# Patient Record
Sex: Male | Born: 1965 | Race: Black or African American | Hispanic: No | Marital: Single | Smoking: Current every day smoker
Health system: Southern US, Community
[De-identification: ages and names within clinical notes are randomized; demographics above are authoritative.]

## PROBLEM LIST (undated history)

## (undated) DIAGNOSIS — F32A Depression, unspecified: Secondary | ICD-10-CM

## (undated) DIAGNOSIS — I82409 Acute embolism and thrombosis of unspecified deep veins of unspecified lower extremity: Secondary | ICD-10-CM

## (undated) DIAGNOSIS — F329 Major depressive disorder, single episode, unspecified: Secondary | ICD-10-CM

---

## 2014-06-14 ENCOUNTER — Inpatient Hospital Stay (HOSPITAL_COMMUNITY): Payer: Worker's Compensation

## 2014-06-14 ENCOUNTER — Encounter (HOSPITAL_COMMUNITY): Payer: Self-pay | Admitting: Certified Registered Nurse Anesthetist

## 2014-06-14 ENCOUNTER — Emergency Department (HOSPITAL_COMMUNITY): Payer: Worker's Compensation

## 2014-06-14 ENCOUNTER — Inpatient Hospital Stay (HOSPITAL_COMMUNITY): Payer: Worker's Compensation | Admitting: Certified Registered Nurse Anesthetist

## 2014-06-14 ENCOUNTER — Inpatient Hospital Stay (HOSPITAL_COMMUNITY)
Admission: EM | Admit: 2014-06-14 | Discharge: 2014-06-22 | DRG: 011 | Disposition: A | Discharge status: Home Health | Payer: Worker's Compensation | Attending: Orthopedic Surgery | Admitting: Orthopedic Surgery

## 2014-06-14 ENCOUNTER — Encounter (HOSPITAL_COMMUNITY): Admission: EM | Disposition: A | Discharge status: Home Health | Payer: Self-pay | Source: Home / Self Care

## 2014-06-14 DIAGNOSIS — S91319A Laceration without foreign body, unspecified foot, initial encounter: Secondary | ICD-10-CM | POA: Diagnosis present

## 2014-06-14 DIAGNOSIS — F17210 Nicotine dependence, cigarettes, uncomplicated: Secondary | ICD-10-CM | POA: Diagnosis present

## 2014-06-14 DIAGNOSIS — D62 Acute posthemorrhagic anemia: Secondary | ICD-10-CM | POA: Diagnosis not present

## 2014-06-14 DIAGNOSIS — R609 Edema, unspecified: Secondary | ICD-10-CM | POA: Diagnosis present

## 2014-06-14 DIAGNOSIS — S92341B Displaced fracture of fourth metatarsal bone, right foot, initial encounter for open fracture: Secondary | ICD-10-CM | POA: Diagnosis present

## 2014-06-14 DIAGNOSIS — S128XXA Fracture of other parts of neck, initial encounter: Secondary | ICD-10-CM | POA: Diagnosis present

## 2014-06-14 DIAGNOSIS — T797XXA Traumatic subcutaneous emphysema, initial encounter: Secondary | ICD-10-CM | POA: Diagnosis present

## 2014-06-14 DIAGNOSIS — S92321B Displaced fracture of second metatarsal bone, right foot, initial encounter for open fracture: Secondary | ICD-10-CM | POA: Diagnosis present

## 2014-06-14 DIAGNOSIS — S92331B Displaced fracture of third metatarsal bone, right foot, initial encounter for open fracture: Secondary | ICD-10-CM | POA: Diagnosis present

## 2014-06-14 DIAGNOSIS — W311XXA Contact with metalworking machines, initial encounter: Secondary | ICD-10-CM | POA: Diagnosis not present

## 2014-06-14 DIAGNOSIS — J9690 Respiratory failure, unspecified, unspecified whether with hypoxia or hypercapnia: Secondary | ICD-10-CM | POA: Diagnosis not present

## 2014-06-14 DIAGNOSIS — S92301B Fracture of unspecified metatarsal bone(s), right foot, initial encounter for open fracture: Secondary | ICD-10-CM | POA: Diagnosis present

## 2014-06-14 DIAGNOSIS — S92911B Unspecified fracture of right toe(s), initial encounter for open fracture: Secondary | ICD-10-CM | POA: Diagnosis present

## 2014-06-14 DIAGNOSIS — Z4659 Encounter for fitting and adjustment of other gastrointestinal appliance and device: Secondary | ICD-10-CM

## 2014-06-14 DIAGNOSIS — Y93H3 Activity, building and construction: Secondary | ICD-10-CM

## 2014-06-14 DIAGNOSIS — T148XXA Other injury of unspecified body region, initial encounter: Secondary | ICD-10-CM

## 2014-06-14 DIAGNOSIS — S1191XA Laceration without foreign body of unspecified part of neck, initial encounter: Principal | ICD-10-CM

## 2014-06-14 DIAGNOSIS — Y9269 Other specified industrial and construction area as the place of occurrence of the external cause: Secondary | ICD-10-CM

## 2014-06-14 DIAGNOSIS — Z930 Tracheostomy status: Secondary | ICD-10-CM

## 2014-06-14 HISTORY — PX: TRACHEOSTOMY TUBE PLACEMENT: SHX814

## 2014-06-14 HISTORY — PX: PERCUTANEOUS PINNING: SHX2209

## 2014-06-14 HISTORY — PX: I & D EXTREMITY: SHX5045

## 2014-06-14 HISTORY — PX: PANENDOSCOPY: SHX2159

## 2014-06-14 LAB — PREPARE FRESH FROZEN PLASMA
UNIT DIVISION: 0
Unit division: 0

## 2014-06-14 LAB — COMPREHENSIVE METABOLIC PANEL
ALT: 15 U/L — AB (ref 17–63)
AST: 23 U/L (ref 15–41)
Albumin: 4.1 g/dL (ref 3.5–5.0)
Alkaline Phosphatase: 91 U/L (ref 38–126)
Anion gap: 12 (ref 5–15)
BILIRUBIN TOTAL: 0.3 mg/dL (ref 0.3–1.2)
BUN: 12 mg/dL (ref 6–20)
CHLORIDE: 103 mmol/L (ref 101–111)
CO2: 23 mmol/L (ref 22–32)
Calcium: 9.3 mg/dL (ref 8.9–10.3)
Creatinine, Ser: 1.68 mg/dL — ABNORMAL HIGH (ref 0.61–1.24)
GFR calc non Af Amer: 46 mL/min — ABNORMAL LOW (ref >60)
GFR, EST AFRICAN AMERICAN: 53 mL/min — AB (ref >60)
GLUCOSE: 100 mg/dL — AB (ref 65–99)
POTASSIUM: 3.2 mmol/L — AB (ref 3.5–5.1)
Sodium: 138 mmol/L (ref 135–145)
Total Protein: 6.6 g/dL (ref 6.5–8.1)

## 2014-06-14 LAB — CBC
HEMATOCRIT: 41.4 % (ref 39.0–52.0)
Hemoglobin: 14.6 g/dL (ref 13.0–17.0)
MCH: 30.7 pg (ref 26.0–34.0)
MCHC: 35.3 g/dL (ref 30.0–36.0)
MCV: 87.2 fL (ref 78.0–100.0)
Platelets: 298 10*3/uL (ref 150–400)
RBC: 4.75 MIL/uL (ref 4.22–5.81)
RDW: 13.1 % (ref 11.5–15.5)
WBC: 12.7 10*3/uL — ABNORMAL HIGH (ref 4.0–10.5)

## 2014-06-14 LAB — ABO/RH: ABO/RH(D): O POS

## 2014-06-14 LAB — PROTIME-INR
INR: 1.31 (ref 0.00–1.49)
PROTHROMBIN TIME: 16.4 s — AB (ref 11.6–15.2)

## 2014-06-14 LAB — CDS SEROLOGY

## 2014-06-14 LAB — MRSA PCR SCREENING: MRSA by PCR: NEGATIVE

## 2014-06-14 LAB — ETHANOL

## 2014-06-14 SURGERY — CREATION, TRACHEOSTOMY
Anesthesia: Monitor Anesthesia Care | Site: Neck | Laterality: Right

## 2014-06-14 MED ORDER — FENTANYL CITRATE (PF) 100 MCG/2ML IJ SOLN
25.0000 ug | INTRAMUSCULAR | Status: DC | PRN
  Administered 2014-06-14: 100 ug via INTRAVENOUS
  Filled 2014-06-14: qty 2

## 2014-06-14 MED ORDER — LACTATED RINGERS IV SOLN
INTRAVENOUS | Status: DC | PRN
  Administered 2014-06-14 (×2): via INTRAVENOUS

## 2014-06-14 MED ORDER — BACITRACIN ZINC 500 UNIT/GM EX OINT
TOPICAL_OINTMENT | CUTANEOUS | Status: AC
  Filled 2014-06-14: qty 28.35

## 2014-06-14 MED ORDER — CEFAZOLIN SODIUM-DEXTROSE 2-3 GM-% IV SOLR
INTRAVENOUS | Status: AC
  Filled 2014-06-14: qty 50

## 2014-06-14 MED ORDER — HYDROMORPHONE HCL 1 MG/ML IJ SOLN: 0.5000 mg | INTRAMUSCULAR | Status: DC | PRN

## 2014-06-14 MED ORDER — RACEPINEPHRINE HCL 2.25 % IN NEBU
0.5000 mL | INHALATION_SOLUTION | RESPIRATORY_TRACT | Status: DC | PRN
  Filled 2014-06-14: qty 0.5

## 2014-06-14 MED ORDER — SODIUM CHLORIDE 0.9 % IV SOLN
INTRAVENOUS | Status: DC | PRN
  Administered 2014-06-14: 17:00:00 via INTRAVENOUS

## 2014-06-14 MED ORDER — ONDANSETRON HCL 4 MG/2ML IJ SOLN: 4.0000 mg | Freq: Once | INTRAMUSCULAR | Status: AC | PRN

## 2014-06-14 MED ORDER — FENTANYL CITRATE (PF) 250 MCG/5ML IJ SOLN
INTRAMUSCULAR | Status: AC
  Filled 2014-06-14: qty 5

## 2014-06-14 MED ORDER — SODIUM CHLORIDE 0.9 % IV SOLN
25.0000 ug/h | INTRAVENOUS | Status: DC
  Administered 2014-06-14: 25 ug/h via INTRAVENOUS
  Administered 2014-06-16: 75 ug/h via INTRAVENOUS
  Filled 2014-06-14 (×2): qty 50

## 2014-06-14 MED ORDER — DEXMEDETOMIDINE HCL IN NACL 200 MCG/50ML IV SOLN
INTRAVENOUS | Status: DC | PRN
  Administered 2014-06-14: 0.7 ug/kg/h via INTRAVENOUS

## 2014-06-14 MED ORDER — FENTANYL CITRATE (PF) 100 MCG/2ML IJ SOLN
50.0000 ug | Freq: Once | INTRAMUSCULAR | Status: AC
Start: 2014-06-14 — End: 2014-06-14
  Administered 2014-06-14 (×2): 50 ug via INTRAVENOUS

## 2014-06-14 MED ORDER — PHENYLEPHRINE HCL 10 MG/ML IJ SOLN
INTRAMUSCULAR | Status: DC | PRN
  Administered 2014-06-14 (×3): 80 ug via INTRAVENOUS

## 2014-06-14 MED ORDER — HYDROMORPHONE HCL 1 MG/ML IJ SOLN
0.5000 mg | INTRAMUSCULAR | Status: DC | PRN
  Administered 2014-06-14: 1 mg via INTRAVENOUS
  Administered 2014-06-14 – 2014-06-17 (×5): 0.5 mg via INTRAVENOUS
  Administered 2014-06-21: 1 mg via INTRAVENOUS
  Filled 2014-06-14 (×7): qty 1

## 2014-06-14 MED ORDER — ONDANSETRON HCL 4 MG/2ML IJ SOLN
INTRAMUSCULAR | Status: AC
  Filled 2014-06-14: qty 2

## 2014-06-14 MED ORDER — ONDANSETRON HCL 4 MG/2ML IJ SOLN
4.0000 mg | Freq: Once | INTRAMUSCULAR | Status: AC
  Administered 2014-06-14: 4 mg via INTRAVENOUS

## 2014-06-14 MED ORDER — MIDAZOLAM HCL 5 MG/5ML IJ SOLN
INTRAMUSCULAR | Status: DC | PRN
  Administered 2014-06-14 (×2): 1 mg via INTRAVENOUS

## 2014-06-14 MED ORDER — MIDAZOLAM HCL 2 MG/2ML IJ SOLN
1.0000 mg | INTRAMUSCULAR | Status: DC | PRN
  Filled 2014-06-14: qty 10

## 2014-06-14 MED ORDER — LIDOCAINE-EPINEPHRINE 1 %-1:100000 IJ SOLN
INTRAMUSCULAR | Status: DC | PRN
  Administered 2014-06-14: 20 mL

## 2014-06-14 MED ORDER — ONDANSETRON HCL 4 MG/2ML IJ SOLN: 4.0000 mg | Freq: Four times a day (QID) | INTRAMUSCULAR | Status: DC | PRN

## 2014-06-14 MED ORDER — SODIUM CHLORIDE 0.9 % IR SOLN
Status: DC | PRN
  Administered 2014-06-14: 3000 mL

## 2014-06-14 MED ORDER — ONDANSETRON HCL 4 MG PO TABS: 4.0000 mg | ORAL_TABLET | Freq: Four times a day (QID) | ORAL | Status: DC | PRN

## 2014-06-14 MED ORDER — 0.9 % SODIUM CHLORIDE (POUR BTL) OPTIME
TOPICAL | Status: DC | PRN
  Administered 2014-06-14: 1000 mL

## 2014-06-14 MED ORDER — LIDOCAINE-EPINEPHRINE 1 %-1:100000 IJ SOLN
INTRAMUSCULAR | Status: AC
  Filled 2014-06-14: qty 1

## 2014-06-14 MED ORDER — FENTANYL CITRATE (PF) 100 MCG/2ML IJ SOLN
INTRAMUSCULAR | Status: AC
  Filled 2014-06-14: qty 2

## 2014-06-14 MED ORDER — SODIUM CHLORIDE 0.9 % IV SOLN: Freq: Once | INTRAVENOUS | Status: DC

## 2014-06-14 MED ORDER — CETYLPYRIDINIUM CHLORIDE 0.05 % MT LIQD
7.0000 mL | Freq: Four times a day (QID) | OROMUCOSAL | Status: DC
  Administered 2014-06-15 – 2014-06-18 (×14): 7 mL via OROMUCOSAL

## 2014-06-14 MED ORDER — MIDAZOLAM HCL 2 MG/2ML IJ SOLN
INTRAMUSCULAR | Status: AC
  Filled 2014-06-14: qty 2

## 2014-06-14 MED ORDER — PANTOPRAZOLE SODIUM 40 MG PO TBEC
40.0000 mg | DELAYED_RELEASE_TABLET | Freq: Every day | ORAL | Status: DC
  Filled 2014-06-14 (×2): qty 1

## 2014-06-14 MED ORDER — CEFAZOLIN SODIUM-DEXTROSE 2-3 GM-% IV SOLR
2.0000 g | Freq: Four times a day (QID) | INTRAVENOUS | Status: DC
  Administered 2014-06-14 – 2014-06-15 (×2): 2 g via INTRAVENOUS
  Filled 2014-06-14 (×3): qty 50

## 2014-06-14 MED ORDER — KCL IN DEXTROSE-NACL 20-5-0.45 MEQ/L-%-% IV SOLN
INTRAVENOUS | Status: DC
  Administered 2014-06-15 – 2014-06-20 (×7): via INTRAVENOUS
  Filled 2014-06-14 (×19): qty 1000

## 2014-06-14 MED ORDER — PANTOPRAZOLE SODIUM 40 MG IV SOLR
40.0000 mg | Freq: Every day | INTRAVENOUS | Status: DC
  Administered 2014-06-14 – 2014-06-21 (×8): 40 mg via INTRAVENOUS
  Filled 2014-06-14 (×8): qty 40

## 2014-06-14 MED ORDER — BACITRACIN-NEOMYCIN-POLYMYXIN OINTMENT TUBE
TOPICAL_OINTMENT | Freq: Three times a day (TID) | CUTANEOUS | Status: DC
  Administered 2014-06-14 – 2014-06-15 (×3): via TOPICAL
  Administered 2014-06-15: 1 via TOPICAL
  Administered 2014-06-16 – 2014-06-18 (×8): via TOPICAL
  Administered 2014-06-19: 1 via TOPICAL
  Administered 2014-06-19 – 2014-06-22 (×9): via TOPICAL
  Filled 2014-06-14 (×2): qty 15

## 2014-06-14 MED ORDER — FENTANYL CITRATE (PF) 100 MCG/2ML IJ SOLN
INTRAMUSCULAR | Status: DC | PRN
Start: 2014-06-14 — End: 2014-06-14
  Administered 2014-06-14: 50 ug via INTRAVENOUS
  Administered 2014-06-14 (×2): 25 ug via INTRAVENOUS
  Administered 2014-06-14: 50 ug via INTRAVENOUS

## 2014-06-14 MED ORDER — MORPHINE SULFATE 4 MG/ML IJ SOLN
6.0000 mg | Freq: Once | INTRAMUSCULAR | Status: AC
Start: 2014-06-14 — End: 2014-06-14
  Administered 2014-06-14: 6 mg via INTRAVENOUS

## 2014-06-14 MED ORDER — DEXMEDETOMIDINE HCL IN NACL 200 MCG/50ML IV SOLN
INTRAVENOUS | Status: AC
  Filled 2014-06-14: qty 50

## 2014-06-14 MED ORDER — PHENYLEPHRINE HCL 10 MG/ML IJ SOLN
10.0000 mg | INTRAMUSCULAR | Status: DC | PRN
  Administered 2014-06-14: 20 ug/min via INTRAVENOUS

## 2014-06-14 MED ORDER — BACITRACIN ZINC 500 UNIT/GM EX OINT
TOPICAL_OINTMENT | CUTANEOUS | Status: DC | PRN
  Administered 2014-06-14: 1 via TOPICAL

## 2014-06-14 MED ORDER — CHLORHEXIDINE GLUCONATE 0.12 % MT SOLN
15.0000 mL | Freq: Two times a day (BID) | OROMUCOSAL | Status: DC
  Administered 2014-06-14 – 2014-06-18 (×8): 15 mL via OROMUCOSAL
  Filled 2014-06-14 (×8): qty 15

## 2014-06-14 MED ORDER — PROPOFOL 1000 MG/100ML IV EMUL
5.0000 ug/kg/min | INTRAVENOUS | Status: DC
  Administered 2014-06-14: 25 ug/kg/min via INTRAVENOUS
  Administered 2014-06-15: 15 ug/kg/min via INTRAVENOUS
  Filled 2014-06-14 (×2): qty 100

## 2014-06-14 MED ORDER — DEXMEDETOMIDINE BOLUS VIA INFUSION
0.7000 ug/kg | Freq: Once | INTRAVENOUS | Status: DC
  Filled 2014-06-14: qty 52

## 2014-06-14 MED ORDER — MORPHINE SULFATE 4 MG/ML IJ SOLN: 6.0000 mg | Freq: Once | INTRAMUSCULAR | Status: DC

## 2014-06-14 MED ORDER — BUPIVACAINE HCL (PF) 0.25 % IJ SOLN
30.0000 mL | INTRAMUSCULAR | Status: AC
  Filled 2014-06-14: qty 30

## 2014-06-14 MED ORDER — LACTATED RINGERS IV SOLN
INTRAVENOUS | Status: DC | PRN
  Administered 2014-06-14: 16:00:00 via INTRAVENOUS

## 2014-06-14 MED ORDER — PROPOFOL 10 MG/ML IV BOLUS
INTRAVENOUS | Status: DC | PRN
  Administered 2014-06-14: 20 mg via INTRAVENOUS
  Administered 2014-06-14: 50 mg via INTRAVENOUS
  Administered 2014-06-14: 120 mg via INTRAVENOUS
  Administered 2014-06-14: 150 mg via INTRAVENOUS
  Administered 2014-06-14: 10 mg via INTRAVENOUS
  Administered 2014-06-14: 30 mg via INTRAVENOUS

## 2014-06-14 MED ORDER — CEFAZOLIN SODIUM-DEXTROSE 2-3 GM-% IV SOLR
2.0000 g | Freq: Once | INTRAVENOUS | Status: AC
  Administered 2014-06-14: 2 g via INTRAVENOUS

## 2014-06-14 MED ORDER — ONDANSETRON HCL 4 MG/2ML IJ SOLN
INTRAMUSCULAR | Status: DC | PRN
  Administered 2014-06-14: 4 mg via INTRAVENOUS

## 2014-06-14 SURGICAL SUPPLY — 100 items
BALLN PULM 15 16.5 18 X 75CM (BALLOONS)
BALLN PULM 15 16.5 18X75 (BALLOONS)
BALLOON PULM 15 16.5 18X75 (BALLOONS) IMPLANT
BANDAGE ELASTIC 4 VELCRO ST LF (GAUZE/BANDAGES/DRESSINGS) ×6 IMPLANT
BANDAGE ELASTIC 6 VELCRO ST LF (GAUZE/BANDAGES/DRESSINGS) ×6 IMPLANT
BLADE SURG 15 STRL LF DISP TIS (BLADE) ×4 IMPLANT
BLADE SURG 15 STRL SS (BLADE) ×2
BLADE SURG ROTATE 9660 (MISCELLANEOUS) IMPLANT
BNDG COHESIVE 4X5 TAN STRL (GAUZE/BANDAGES/DRESSINGS) ×6 IMPLANT
BNDG GAUZE ELAST 4 BULKY (GAUZE/BANDAGES/DRESSINGS) ×6 IMPLANT
BRUSH SCRUB EZ PLAIN DRY (MISCELLANEOUS) ×12 IMPLANT
CANISTER SUCTION 2500CC (MISCELLANEOUS) ×12 IMPLANT
CAP PIN ORTHO PINK (CAP) ×6 IMPLANT
CLEANER TIP ELECTROSURG 2X2 (MISCELLANEOUS) ×6 IMPLANT
CONT SPEC 4OZ CLIKSEAL STRL BL (MISCELLANEOUS) IMPLANT
COVER MAYO STAND STRL (DRAPES) IMPLANT
COVER SURGICAL LIGHT HANDLE (MISCELLANEOUS) ×24 IMPLANT
COVER TABLE BACK 60X90 (DRAPES) ×6 IMPLANT
CUFF TOURNIQUET SINGLE 24IN (TOURNIQUET CUFF) ×6 IMPLANT
CUFF TOURNIQUET SINGLE 34IN LL (TOURNIQUET CUFF) IMPLANT
DECANTER SPIKE VIAL GLASS SM (MISCELLANEOUS) ×6 IMPLANT
DRAPE EXTREMITY T 121X128X90 (DRAPE) IMPLANT
DRAPE OEC MINIVIEW 54X84 (DRAPES) ×6 IMPLANT
DRAPE PROXIMA HALF (DRAPES) ×6 IMPLANT
DRSG ADAPTIC 3X8 NADH LF (GAUZE/BANDAGES/DRESSINGS) ×6 IMPLANT
DURAPREP 26ML APPLICATOR (WOUND CARE) ×6 IMPLANT
ELECT COATED BLADE 2.86 ST (ELECTRODE) ×6 IMPLANT
ELECT REM PT RETURN 9FT ADLT (ELECTROSURGICAL) ×6
ELECTRODE REM PT RTRN 9FT ADLT (ELECTROSURGICAL) ×4 IMPLANT
EVACUATOR 1/8 PVC DRAIN (DRAIN) IMPLANT
FACESHIELD WRAPAROUND (MASK) ×6 IMPLANT
FLUID NSS /IRRIG 3000 ML XXX (IV SOLUTION) ×6 IMPLANT
GAUZE SPONGE 4X4 12PLY STRL (GAUZE/BANDAGES/DRESSINGS) ×6 IMPLANT
GAUZE SPONGE 4X4 16PLY XRAY LF (GAUZE/BANDAGES/DRESSINGS) ×6 IMPLANT
GAUZE XEROFORM 1X8 LF (GAUZE/BANDAGES/DRESSINGS) ×6 IMPLANT
GAUZE XEROFORM 5X9 LF (GAUZE/BANDAGES/DRESSINGS) IMPLANT
GLOVE BIO SURGEON STRL SZ 6.5 (GLOVE) ×5 IMPLANT
GLOVE BIO SURGEON STRL SZ7 (GLOVE) ×6 IMPLANT
GLOVE BIO SURGEON STRL SZ7.5 (GLOVE) ×6 IMPLANT
GLOVE BIO SURGEON STRL SZ8 (GLOVE) ×6 IMPLANT
GLOVE BIO SURGEONS STRL SZ 6.5 (GLOVE) ×1
GLOVE BIOGEL PI IND STRL 6.5 (GLOVE) ×8 IMPLANT
GLOVE BIOGEL PI IND STRL 7.0 (GLOVE) ×4 IMPLANT
GLOVE BIOGEL PI INDICATOR 6.5 (GLOVE) ×4
GLOVE BIOGEL PI INDICATOR 7.0 (GLOVE) ×2
GLOVE ORTHO TXT STRL SZ7.5 (GLOVE) ×12 IMPLANT
GLOVE SURG SS PI 7.5 STRL IVOR (GLOVE) ×18 IMPLANT
GOWN STRL REUS W/ TWL LRG LVL3 (GOWN DISPOSABLE) ×24 IMPLANT
GOWN STRL REUS W/TWL 2XL LVL3 (GOWN DISPOSABLE) ×6 IMPLANT
GOWN STRL REUS W/TWL LRG LVL3 (GOWN DISPOSABLE) ×12
GUARD TEETH (MISCELLANEOUS) ×6 IMPLANT
HANDPIECE INTERPULSE COAX TIP (DISPOSABLE)
HEMOSTAT NU-KNIT SURGICAL 3X4 (HEMOSTASIS) ×12 IMPLANT
HEMOSTAT SURGICEL 2X14 (HEMOSTASIS) ×6 IMPLANT
HOLDER TRACH TUBE VELCRO 19.5 (MISCELLANEOUS) ×12 IMPLANT
K-WIRE .045 CH (WIRE) ×12
KIT BASIN OR (CUSTOM PROCEDURE TRAY) ×12 IMPLANT
KIT ROOM TURNOVER OR (KITS) ×12 IMPLANT
KIT SUCTION CATH 14FR (SUCTIONS) IMPLANT
KWIRE .045 CH (WIRE) ×8 IMPLANT
MANIFOLD NEPTUNE II (INSTRUMENTS) ×6 IMPLANT
NEEDLE HYPO 25GX1X1/2 BEV (NEEDLE) ×6 IMPLANT
NS IRRIG 1000ML POUR BTL (IV SOLUTION) ×12 IMPLANT
PACK EENT II TURBAN DRAPE (CUSTOM PROCEDURE TRAY) ×6 IMPLANT
PACK ORTHO EXTREMITY (CUSTOM PROCEDURE TRAY) ×6 IMPLANT
PAD ARMBOARD 7.5X6 YLW CONV (MISCELLANEOUS) ×24 IMPLANT
PATTIES SURGICAL .5 X1 (DISPOSABLE) ×6 IMPLANT
PENCIL BUTTON HOLSTER BLD 10FT (ELECTRODE) ×6 IMPLANT
PLATE MID FACE 7H Y DOUBLE (Plate) ×6 IMPLANT
SCREW MIDFACE 1.7X4 SLF DRILL (Screw) ×24 IMPLANT
SCREW MIDFACE 1.7X5 SLF DRILL (Screw) ×6 IMPLANT
SET HNDPC FAN SPRY TIP SCT (DISPOSABLE) IMPLANT
SPLINT PLASTER CAST XFAST 5X30 (CAST SUPPLIES) ×4 IMPLANT
SPLINT PLASTER XFAST SET 5X30 (CAST SUPPLIES) ×2
SPONGE INTESTINAL PEANUT (DISPOSABLE) IMPLANT
SPONGE LAP 18X18 X RAY DECT (DISPOSABLE) ×6 IMPLANT
STOCKINETTE IMPERVIOUS 9X36 MD (GAUZE/BANDAGES/DRESSINGS) IMPLANT
SUCTION FRAZIER TIP 10 FR DISP (SUCTIONS) ×12 IMPLANT
SURGILUBE 2OZ TUBE FLIPTOP (MISCELLANEOUS) IMPLANT
SUT CHROMIC 2 0 SH (SUTURE) ×6 IMPLANT
SUT ETHILON 2 0 FS 18 (SUTURE) ×12 IMPLANT
SUT ETHILON 3 0 PS 1 (SUTURE) ×6 IMPLANT
SUT SILK 2 0 FS (SUTURE) ×18 IMPLANT
SUT SILK 3 0 REEL (SUTURE) ×6 IMPLANT
SUT VIC AB 2-0 FS1 27 (SUTURE) ×12 IMPLANT
SUT VIC AB 3-0 FS2 27 (SUTURE) ×6 IMPLANT
SYR 20CC LL (SYRINGE) ×6 IMPLANT
SYR BULB IRRIGATION 50ML (SYRINGE) IMPLANT
SYR CONTROL 10ML LL (SYRINGE) ×6 IMPLANT
TOWEL OR 17X24 6PK STRL BLUE (TOWEL DISPOSABLE) ×18 IMPLANT
TOWEL OR 17X26 10 PK STRL BLUE (TOWEL DISPOSABLE) ×6 IMPLANT
TOWEL OR NON WOVEN STRL DISP B (DISPOSABLE) ×6 IMPLANT
TUBE ANAEROBIC SPECIMEN COL (MISCELLANEOUS) IMPLANT
TUBE CONNECTING 12'X1/4 (SUCTIONS) ×3
TUBE CONNECTING 12X1/4 (SUCTIONS) ×15 IMPLANT
TUBE TRACH SHILEY  6 DIST  CUF (TUBING) ×6 IMPLANT
TUBING CYSTO DISP (UROLOGICAL SUPPLIES) ×6 IMPLANT
UNDERPAD 30X30 INCONTINENT (UNDERPADS AND DIAPERS) ×12 IMPLANT
WATER STERILE IRR 1000ML POUR (IV SOLUTION) IMPLANT
YANKAUER SUCT BULB TIP NO VENT (SUCTIONS) ×6 IMPLANT

## 2014-06-14 NOTE — H&P (Signed)
06/14/2014  3:34 PM  Arthur Martin  PREOPERATIVE HISTORY AND PHYSICAL/CONSULT NOTE  CHIEF COMPLAINT: neck injury with thyroid cartilage and hyoid fractures  HISTORY: This is a 49 year old who was apparently at work when a grinder broke and struck him in the neck and foot. He now presents with hoarseness, neck laceration, left thyroid cartilage fracture, and right hyoid fracture. He now presents for awake tracheotomy, repair of neck laceration, possible repair of thyroid fracture, and direct laryngoscopy and esophagoscopy.  Dr. Emeline Darling, [place] has discussed the risks (airway injury, death, anoxic brain injury, bleeding, infection, nonunion, malunion, need for revision surgery, etc.), benefits, and alternatives of this procedure. The patient understands the risks and would like to proceed with the procedure. The chances of success of the procedure are >25% and the patient understands this. I personally performed an examination of the patient within 24 hours of the procedure.  PAST MEDICAL HISTORY: No past medical history on file.   PAST SURGICAL HISTORY: No past surgical history on file.  MEDICATIONS: No current facility-administered medications on file prior to encounter.   No current outpatient prescriptions on file prior to encounter.    ALLERGIES: No Known Allergies.    SOCIAL HISTORY: History   Social History  . Marital Status: Single    Spouse Name: N/A  . Number of Children: N/A  . Years of Education: N/A   Occupational History  . Not on file.   Social History Main Topics  . Smoking status: Not on file  . Smokeless tobacco: Not on file  . Alcohol Use: Not on file  . Drug Use: Not on file  . Sexual Activity: Not on file   Other Topics Concern  . Not on file   Social History Narrative  . No narrative on file    FAMILY HISTORY: No family history on file.  REVIEW OF SYSTEMS:   neck pain, hoarseness, neck swelling, foot pain, otherwise negative x 12 systems  except per HPI  PHYSICAL EXAM:  GENERAL: sitting upright with oxygen, no stridor but hoarse voice when phonating  VITAL SIGNS:   Filed Vitals:   06/14/14 1500  BP: 140/97  Pulse: 83  Resp: 21   SKIN:  Warm, dry HEENT/NECK: :  Neck with an ~ 3-4 cm laceration overlying thyroid cartilage with some mild/moderate neck swelling. Trachea midline, some palpable neck crepitance.  ABDOMEN:  soft MUSCULOSKELETAL: normal strength PSYCH:  Normal affect NEUROLOGIC:  Cn 2-12 intact and symmetric  DIAGNOSTIC STUDIES: CT neck shows no IJ or carotid injury, mildly displaced right hyoid fracture, mildly displaced left lateral thyroid cartilage fracture, cricoid and tracheal cartilages intact, diffuse bilateral neck soft tissue emphysema.  Procedure note: 31575 flexible laryngoscopy: after topicalizing the neck with pseudoephedrine spray the nose and larynx were examined using the 4mm flexible scope. The septum is deviated to the right but the middle and inferior turbinates are grossly normal. The epiglottis appears normal. The true vocal folds appear intact with no masses or lesions and normal adduction and abduction bilaterally. There is some minimal/mild symmetric true vocal fold mucosal edema but the glottis airway is patent. There is significant edema over the left greater than right arytenoids and the left piriform sinus is effaced.  ASSESSMENT AND PLAN: Plan to proceed with awake tracheotomy given the hyoid/thyroid fractures and arytenoid edema. Will also perform washout and repair of the neck laceration, possible repair of the thyroid fracture, and direct laryngoscopy and esophagoscopy. Patient understands the risks, benefits, and alternatives. Informed written consent signed  witnessed and on chart. 06/14/2014  3:34 PM Arthur BeamGore, [place]

## 2014-06-14 NOTE — Anesthesia Preprocedure Evaluation (Addendum)
Anesthesia Evaluation  Patient identified by MRN, date of birth, ID band Patient awake    Reviewed: Allergy & Precautions, NPO status , Patient's Chart, lab work & pertinent test results  Airway Mallampati: I     Mouth opening: Limited Mouth Opening  Dental  (+) Teeth Intact, Dental Advisory Given,    Pulmonary Current Smoker,    Pulmonary exam normal       Cardiovascular Normal cardiovascular exam    Neuro/Psych    GI/Hepatic   Endo/Other    Renal/GU      Musculoskeletal   Abdominal   Peds  Hematology   Anesthesia Other Findings   Reproductive/Obstetrics                         Anesthesia Physical Anesthesia Plan  ASA: III and emergent  Anesthesia Plan: MAC and General   Post-op Pain Management:    Induction: Intravenous  Airway Management Planned: Tracheostomy  Additional Equipment:   Intra-op Plan:   Post-operative Plan:   Informed Consent: I have reviewed the patients History and Physical, chart, labs and discussed the procedure including the risks, benefits and alternatives for the proposed anesthesia with the patient or authorized representative who has indicated his/her understanding and acceptance.     Plan Discussed with: Anesthesiologist, CRNA and Surgeon  Anesthesia Plan Comments:       Anesthesia Quick Evaluation

## 2014-06-14 NOTE — ED Notes (Signed)
Report given to OR.

## 2014-06-14 NOTE — ED Provider Notes (Signed)
CSN: [id number]     Arrival date & time 06/14/14  1319 History   First MD Initiated Contact with Patient 06/14/14 1328     No chief complaint on file.    (Consider location/radiation/quality/duration/timing/severity/associated sxs/prior Treatment) Patient is a 49 y.o. male presenting with trauma. The history is provided by the patient and the EMS personnel.  Trauma Mechanism of injury: grinder saw to neck Injury location: head/neck and foot Injury location detail: neck and R toes Incident location: at work Time since incident: 30 minutes Arrived directly from scene: yes   Protective equipment:       None  EMS/PTA data:      Ambulatory at scene: yes      Blood loss: minimal      Responsiveness: alert      Oriented to: person, place, situation and time      Loss of consciousness: no      Amnesic to event: no      Airway interventions: none      Breathing interventions: none      Cardiac interventions: none      Medications administered: none      Immobilization: none      Airway condition since incident: stable      Breathing condition since incident: stable      Circulation condition since incident: stable      Mental status condition since incident: stable      Disability condition since incident: stable  Current symptoms:      Pain quality: sharp      Pain timing: constant      Associated symptoms:            Reports neck pain (anterior neck).            Denies abdominal pain, back pain, chest pain, difficulty breathing, headache, loss of consciousness, nausea and vomiting.    History reviewed. No pertinent past medical history. Past Surgical History  Procedure Laterality Date  . Tracheostomy tube placement N/A 06/14/2014    Procedure: TRACHEOSTOMY washout and repair of  neck laceration;  Surgeon: Melvenia Beam, MD;  Location: [facility] OR;  Service: ENT;  Laterality: N/A;  . Panendoscopy N/A 06/14/2014    Procedure: PANENDOSCOPY;  Surgeon: Melvenia Beam, MD;  Location: [facility]  OR;  Service: ENT;  Laterality: N/A;  . I&d extremity N/A 06/14/2014    Procedure: IRRIGATION AND DEBRIDEMENT EXTREMITY;  Surgeon: Sheral Apley, MD;  Location: [facility] OR;  Service: Orthopedics;  Laterality: N/A;  . Percutaneous pinning Right 06/14/2014    Procedure: PERCUTANEOUS PINNING EXTREMITY OF RIGHT SECOND AND THIRD METATARSALS;  Surgeon: Sheral Apley, MD;  Location: [facility] OR;  Service: Orthopedics;  Laterality: Right;   History reviewed. No pertinent family history. History  Substance Use Topics  . Smoking status: Current Every Day Smoker  . Smokeless tobacco: Not on file  . Alcohol Use: No    Review of Systems  Constitutional: Negative for diaphoresis and fatigue.  HENT: Positive for sore throat, trouble swallowing and voice change (hoarse voice).   Respiratory: Negative for cough, choking, chest tightness, shortness of breath, wheezing and stridor.   Cardiovascular: Negative for chest pain and leg swelling.  Gastrointestinal: Negative for nausea, vomiting, abdominal pain and abdominal distention.  Musculoskeletal: Positive for neck pain (anterior neck). Negative for back pain and neck stiffness.       R foot pain  Skin: Positive for wound (lacerations to anterior neck and R toes). Negative for color change, pallor  and rash.  Neurological: Negative for dizziness, loss of consciousness, syncope, facial asymmetry, weakness, light-headedness, numbness and headaches.  All other systems reviewed and are negative.     Allergies  Review of patient's allergies indicates no known allergies.  Home Medications   Prior to Admission medications   Not on File   BP 129/72 mmHg  Pulse 85  Temp(Src) 100.3 F (37.9 C) (Oral)  Resp 16  Ht 5\' 11"  (1.803 m)  Wt 163 lb 2.3 oz (74 kg)  BMI 22.76 kg/m2  SpO2 100% Physical Exam  Constitutional: He is oriented to person, place, and time. He appears well-developed and well-nourished. No distress.  HENT:  Head: Normocephalic and  atraumatic.  Spitting up blood tinged sputum intermittently  Eyes: Conjunctivae and EOM are normal. Pupils are equal, round, and reactive to light.  Neck: Normal range of motion. Neck supple. Tracheal tenderness present. No tracheal deviation present.    Cardiovascular: Normal rate, regular rhythm, normal heart sounds and intact distal pulses.  Exam reveals no gallop and no friction rub.   No murmur heard. Pulmonary/Chest: Effort normal and breath sounds normal. No stridor. No respiratory distress. He has no wheezes. He has no rales.  Abdominal: Soft. Bowel sounds are normal. He exhibits no distension. There is no tenderness. There is no rebound and no guarding.  Musculoskeletal: Normal range of motion.       Right foot: There is tenderness, bony tenderness, deformity and laceration. There is normal range of motion and normal capillary refill.       Feet:  Neurological: He is alert and oriented to person, place, and time. He has normal strength. No cranial nerve deficit or sensory deficit. GCS eye subscore is 4. GCS verbal subscore is 5. GCS motor subscore is 6.  Skin: Skin is warm and dry. No rash noted. He is not diaphoretic. No erythema. No pallor.  Nursing note and vitals reviewed.   ED Course  Procedures (including critical care time) Labs Review Labs Reviewed  COMPREHENSIVE METABOLIC PANEL - Abnormal; Notable for the following:    Potassium 3.2 (*)    Glucose, Bld 100 (*)    Creatinine, Ser 1.68 (*)    ALT 15 (*)    GFR calc non Af Amer 46 (*)    GFR calc Af Amer 53 (*)    All other components within normal limits  CBC - Abnormal; Notable for the following:    WBC 12.7 (*)    All other components within normal limits  PROTIME-INR - Abnormal; Notable for the following:    Prothrombin Time 16.4 (*)    All other components within normal limits  CBC - Abnormal; Notable for the following:    WBC 12.5 (*)    RBC 4.06 (*)    Hemoglobin 11.8 (*)    HCT 35.6 (*)    All other  components within normal limits  BASIC METABOLIC PANEL - Abnormal; Notable for the following:    Glucose, Bld 109 (*)    Creatinine, Ser 1.52 (*)    Calcium 8.6 (*)    GFR calc non Af Amer 52 (*)    All other components within normal limits  GLUCOSE, CAPILLARY - Abnormal; Notable for the following:    Glucose-Capillary 101 (*)    All other components within normal limits  MRSA PCR SCREENING  CDS SEROLOGY  ETHANOL  GLUCOSE, CAPILLARY  CBC  TYPE AND SCREEN  PREPARE FRESH FROZEN PLASMA  ABO/RH  PREPARE FRESH FROZEN PLASMA  Imaging Review Ct Angio Neck W/cm &/or Wo/cm  06/14/2014   CLINICAL DATA:  Struck in neck by metal object.  Hoarseness.  EXAM: CT ANGIOGRAPHY NECK  TECHNIQUE: Multidetector CT imaging of the neck was performed using the standard protocol during bolus administration of intravenous contrast. Multiplanar CT image reconstructions and MIPs were obtained to evaluate the vascular anatomy. Carotid stenosis measurements (when applicable) are obtained utilizing NASCET criteria, using the distal internal carotid diameter as the denominator.  CONTRAST:  Omnipaque 350, 60 mL.  COMPARISON:  None.  FINDINGS: Aortic arch: Standard branching. Imaged portion shows no evidence of aneurysm or dissection. No significant stenosis of the major arch vessel origins.  Right carotid system: No evidence of dissection, stenosis (50% or greater) or occlusion.  Left carotid system: No evidence of dissection, stenosis (50% or greater) or occlusion.  Vertebral arteries: Codominant. No evidence of dissection, stenosis (50% or greater) or occlusion.  Other neck: There is a soft tissue injury to the neck which is extensive, related to a penetrating wound in the RIGHT lower neck. There is a moderate-sized associated hematoma. The thyroid cartilage is comminuted on the LEFT, but apparently intact on the RIGHT. The hyoid bone is fractured, with displacement of the RIGHT cornu. The cricoid appears intact.  There is  moderate edema in the hypopharynx with a hematoma at the glottic level. No metallic foreign body is seen. Cervical spondylosis is present. No mandibular fracture. Incidental periodontal disease with periapical lucencies. Chronic maxillary sinus retention cysts. No pneumothorax. Pneumomediastinum is present.  IMPRESSION: Extensive RIGHT neck penetrating injury with localized hematoma and subcutaneous emphysema. No vascular injury.  Subcutaneous emphysema has dissected downward with pneumomediastinum. There is a fracture of the hyoid bone and thyroid cartilage.  Glottic edema is present.  ENT consultation is warranted.   Electronically Signed   By: Davonna Belling M.D.   On: 06/14/2014 14:52   Dg Chest Port 1 View  06/14/2014   CLINICAL DATA:  Emergency tracheostomy tube insertion.  EXAM: PORTABLE CHEST - 1 VIEW  COMPARISON:  06/14/2014  FINDINGS: Tracheostomy tube has its tip projecting in the upper thoracic trachea, well positioned.  New nasogastric tube passes below the diaphragm to curl within the stomach.  Mild basilar linear atelectasis. No convincing pneumonia or edema. No pleural effusion or pneumothorax.  Cardiac silhouette is normal in size. No mediastinal or hilar masses.  IMPRESSION: 1. Tracheostomy tube and nasogastric tube are well positioned. 2. No acute cardiopulmonary disease.   Electronically Signed   By: Amie Portland M.D.   On: 06/14/2014 20:02   Dg Chest Portable 1 View  06/14/2014   CLINICAL DATA:  Laceration to right-sided neck with saw blade today  EXAM: PORTABLE CHEST - 1 VIEW  COMPARISON:  None.  FINDINGS: The heart size and mediastinal contours are within normal limits. Both lungs are clear. The visualized skeletal structures are unremarkable.  IMPRESSION: No active disease.   Electronically Signed   By: Esperanza Heir M.D.   On: 06/14/2014 13:47   Dg Abd Portable 1v  06/14/2014   CLINICAL DATA:  Evaluate nasogastric tube insertion.  EXAM: PORTABLE ABDOMEN - 1 VIEW  COMPARISON:  None.   FINDINGS: NG tube tip projects in the distal stomach. Normal bowel gas pattern. No significant bony or soft tissue abnormality.  IMPRESSION: Well-positioned nasogastric tube.   Electronically Signed   By: Amie Portland M.D.   On: 06/14/2014 20:02   Dg Foot Complete Right  06/14/2014   CLINICAL DATA:  Postop  right foot surgery.  Initial encounter.  EXAM: RIGHT FOOT COMPLETE - 3+ VIEW  COMPARISON:  Radiographs 06/14/2014.  FINDINGS: Examination was obtained portably. Bone detail is limited by overlying splint. Interval K-wire fixation of the fractures of the second and third metatarsal necks. The main fracture fragments demonstrate near anatomic reduction. Comminuted fracture of the fourth proximal phalanx appears grossly unchanged.  IMPRESSION: Interval K-wire fixation of the second and third metatarsal fractures as described.   Electronically Signed   By: Carey Bullocks M.D.   On: 06/14/2014 21:41   Dg Foot Complete Right  06/14/2014   CLINICAL DATA:  Trauma lacerations second third and fourth metatarsals right foot  EXAM: RIGHT FOOT COMPLETE - 3+ VIEW  COMPARISON:  None.  FINDINGS: Comminuted fracture distal shaft of the second and third metatarsals, mildly displaced. A third metatarsal shaft fracture it extends into the MTP joint. Comminuted fracture fourth proximal phalanx, mildly displaced, with fracture line extending into metatarsophalangeal joint. A few punctate radiodensities project over the lateral soft tissues of the fourth toe which may be on the dermis or associated with laceration.  IMPRESSION: Multiple fractures.  Possible punctate foreign body.   Electronically Signed   By: Esperanza Heir M.D.   On: 06/14/2014 13:59     EKG Interpretation None      MDM   Final diagnoses:  Laceration of neck    49 yo M with no significant PMH presenting as Level I trauma s/p saw injury to neck. Pt was at work when piece of grinder saw broke off, flew up into R foot and then neck.  On EMS  arrival, wound hemostatic and pt not in respiratory distress.  VSS en route.  On presentation, pt alert, VSS.  Exam with laceration extending R of midline neck up to R cheek.  Hematoma to R submandibular area, non-expanding.  Pt able to speak in full sentences.  Bleeding controlled, non-arterial.  Also with laceration to R 4th toe. Portable CXR without acute abnormality. CTA neck ordered per Trauma as well as XR R foot.  CTA shows hyoid bone and thyroid cartilage fracture with glottic edema, localized hematoma with subQ emphysema dissecting into mediastinum, no vascular injury.  ENT consulted for eval.  XR foot with multiple fractures, consistent with open fracture.  Ortho consulted.  Per ENT, pt taken to OR emergently for possible trach placement.  Remained in guarded but stable condition during my care.  Discussed with attending Dr. Ethelda Chick.    Jodean Lima, MD 06/15/14 1722  Doug Sou, MD 06/16/14 [phone]

## 2014-06-14 NOTE — ED Notes (Signed)
Trauma end at 1602-- to OR

## 2014-06-14 NOTE — Consult Note (Signed)
ORTHOPAEDIC CONSULTATION  REQUESTING PHYSICIAN: Trauma Md, MD  Chief Complaint: Open Right foot fractures  HPI: Arthur Martin is a 49 y.o. male who complains of a grinder wheel that he was using broke sending pieces flying he was struck in the throat as well as the right foot through his shoe. He complains of pain in his right foot. He was not struck anywhere else per his report or his companions report.  No past medical history on file. No past surgical history on file. History   Social History  . Marital Status: Single    Spouse Name: N/A  . Number of Children: N/A  . Years of Education: N/A   Social History Main Topics  . Smoking status: Not on file  . Smokeless tobacco: Not on file  . Alcohol Use: Not on file  . Drug Use: Not on file  . Sexual Activity: Not on file   Other Topics Concern  . Not on file   Social History Narrative  . No narrative on file   No family history on file. No Known Allergies Prior to Admission medications   Not on File   Ct Angio Neck W/cm &/or Wo/cm  06/14/2014   CLINICAL DATA:  Struck in neck by metal object.  Hoarseness.  EXAM: CT ANGIOGRAPHY NECK  TECHNIQUE: Multidetector CT imaging of the neck was performed using the standard protocol during bolus administration of intravenous contrast. Multiplanar CT image reconstructions and MIPs were obtained to evaluate the vascular anatomy. Carotid stenosis measurements (when applicable) are obtained utilizing NASCET criteria, using the distal internal carotid diameter as the denominator.  CONTRAST:  Omnipaque 350, 60 mL.  COMPARISON:  None.  FINDINGS: Aortic arch: Standard branching. Imaged portion shows no evidence of aneurysm or dissection. No significant stenosis of the major arch vessel origins.  Right carotid system: No evidence of dissection, stenosis (50% or greater) or occlusion.  Left carotid system: No evidence of dissection, stenosis (50% or greater) or occlusion.  Vertebral arteries:  Codominant. No evidence of dissection, stenosis (50% or greater) or occlusion.  Other neck: There is a soft tissue injury to the neck which is extensive, related to a penetrating wound in the RIGHT lower neck. There is a moderate-sized associated hematoma. The thyroid cartilage is comminuted on the LEFT, but apparently intact on the RIGHT. The hyoid bone is fractured, with displacement of the RIGHT cornu. The cricoid appears intact.  There is moderate edema in the hypopharynx with a hematoma at the glottic level. No metallic foreign body is seen. Cervical spondylosis is present. No mandibular fracture. Incidental periodontal disease with periapical lucencies. Chronic maxillary sinus retention cysts. No pneumothorax. Pneumomediastinum is present.  IMPRESSION: Extensive RIGHT neck penetrating injury with localized hematoma and subcutaneous emphysema. No vascular injury.  Subcutaneous emphysema has dissected downward with pneumomediastinum. There is a fracture of the hyoid bone and thyroid cartilage.  Glottic edema is present.  ENT consultation is warranted.   Electronically Signed   By: Rolla Flatten M.D.   On: 06/14/2014 14:52   Dg Chest Portable 1 View  06/14/2014   CLINICAL DATA:  Laceration to right-sided neck with saw blade today  EXAM: PORTABLE CHEST - 1 VIEW  COMPARISON:  None.  FINDINGS: The heart size and mediastinal contours are within normal limits. Both lungs are clear. The visualized skeletal structures are unremarkable.  IMPRESSION: No active disease.   Electronically Signed   By: Skipper Cliche M.D.   On: 06/14/2014 13:47  Dg Foot Complete Right  06/14/2014   CLINICAL DATA:  Trauma lacerations second third and fourth metatarsals right foot  EXAM: RIGHT FOOT COMPLETE - 3+ VIEW  COMPARISON:  None.  FINDINGS: Comminuted fracture distal shaft of the second and third metatarsals, mildly displaced. A third metatarsal shaft fracture it extends into the MTP joint. Comminuted fracture fourth proximal  phalanx, mildly displaced, with fracture line extending into metatarsophalangeal joint. A few punctate radiodensities project over the lateral soft tissues of the fourth toe which may be on the dermis or associated with laceration.  IMPRESSION: Multiple fractures.  Possible punctate foreign body.   Electronically Signed   By: Skipper Cliche M.D.   On: 06/14/2014 13:59    Positive ROS: All other systems have been reviewed and were otherwise negative with the exception of those mentioned in the HPI and as above.  Labs cbc  Recent Labs  06/14/14 1329  WBC 12.7*  HGB 14.6  HCT 41.4  PLT 298    Labs inflam No results for input(s): CRP in the last 72 hours.  Invalid input(s): ESR  Labs coag  Recent Labs  06/14/14 1329  INR 1.31     Recent Labs  06/14/14 1329  NA 138  K 3.2*  CL 103  CO2 23  GLUCOSE 100*  BUN 12  CREATININE 1.68*  CALCIUM 9.3    Physical Exam: Filed Vitals:   06/14/14 1500  BP: 140/97  Pulse: 83  Resp: 21   General: Alert, no acute distress Cardiovascular: No pedal edema Respiratory: No cyanosis, no use of accessory musculature GI: No organomegaly, abdomen is soft and non-tender Skin: No lesions in the area of chief complaint other than those listed below in MSK exam.  Neurologic: Sensation intact distally Psychiatric: Patient is competent for consent with normal mood and affect Lymphatic: No axillary or cervical lymphadenopathy  MUSCULOSKELETAL:  RLE: multiple small abrasions with a laceration at the base of his dorsal 4th toe. He has SILT at all toe tips and is able to wiggle his toes. Compartments soft Other extremities are atraumatic with painless ROM and NVI.  Assessment: Open Fractures of: 2nd MT, 3rd MT, 4th P1   Plan: Irrigation and debridement in the OR with skin closure and possible fracture fixation if unstable.  NWB RLE   Renette Butters, MD Cell [phone]   06/14/2014 3:13 PM
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
## 2014-06-14 NOTE — ED Notes (Signed)
Pt undressed-- dr. Emeline DarlingGore and Dr. Eulah PontMurphy at bedside-- permits signed for procedure.

## 2014-06-14 NOTE — Op Note (Signed)
06/14/2014 6:38 PM  [name],  Arthur Martin [id number]  Pre-Op Dx: hyoid bone and left thyroid cartilage fractures, neck laceration, airway edema/airway injury  Post-Op Dx:  hyoid bone and left thyroid cartilage fractures, neck laceration, airway edema/airway injury  Proc:  Awake Tracheostomy Direct laryngoscopy Cervical esophagoscopy Complex repair of 4cm neck laceration Open reduction and internal fixation of thyroid cartilage fracture  Surg:  Melvenia BeamGore, Latiya Navia  Anes:  Local converted to general via tracheostomy  EBL:  250mL  Comp:  none  Findings:  Moderately displaced right hyoid bone and left lateral thyroid cartilage fractures, grade I laryngeal view with the anterior commissure scope but significant false vocal fold and arytenoid edema. Normal cervical esophagus. Normal tracheal anatomy.  Procedure:  The patient was brought from the ER to the operating room and transferred to an operating table.  The patient was placed in a slight reverse Trendelenburg.  Neck extension was achieved as possible.  The lower neck was palpated and marked per routine.  1% Xylocaine with 1:100,000 epinephrine was infiltrated into the surgical field for intraoperative hemostasis.  Several minutes were allowed for this to take effect.  A Betadine sterile preparation  of the lower neck and upper chest and the neck laceration was performed in the standard fashion.  Sterile draping was accomplished in the standard fashion.  A  4 cm transverse incision was made sharply approximately halfway between the sternal notch and cricoid cartilage and extended through skin and subcutaneous fat.  Using cautery, the superficial layer of the deep cervical fascia was lysed.  Additional dissection revealed the strap muscles.  The midline raphe was divided in two layers and the muscles retracted laterally.  The pretracheal plane was visualized.  This was entered bluntly.  The thyroid isthmus was isolated between hemostats, divided, and  controlled with 2-0 silk suture ligatures.  The thyroid gland was retracted to either side.  The anterior face of the trachea was cleared.  In the  2-3 interspace, a transverse incision was made between cartilage rings into the tracheal lumen.  A 6 mm wide inferiorly based flap was generated and secured to the lower wound with a 2-0 vicryl suture.  Mucosal edges were cauterized for hemostasis.  A previously tested  # 6 Shiley cuffed tracheostomy tube was brought into the field. The tracheostomy tube was inserted into the tracheal lumen.  Hemostasis was observed. The cuff was inflated and observed to be intact and containing pressure. The inner cannula was placed and ventilation assumed per tracheostomy tube.  Good chest wall motion was observed, and CO2 was documented per anesthesia.  The trach tube was secured in the standard fashion with velcro ties. And 2-0 silk sutures.  Hemostasis was observed again.    The neck laceration was opened more widely with the Bovie and the right hyoid fracture exposed. This was left to heal secondarily. Next I used the Bovie and Therapist, nutritionalreer elevator to expose the left lateral thyroid cartilage fracture. As this was unstable and displaced I plated the fracture with a 1.7 capital-I shaped plate with the plate secure with two 4mm self-drilling screws medially and one 5mm screw laterally. Due to the instability of the cartilage and lateral location of the fracture I was unable to place any additional screws, but the three screws placed were nicely secure and the plate was flush to the cartilage and the fracture stable.  Next I closed the 4cm neck laceration over the repaired thyroid cartilage fracture using deep 2-0 vicryl for the muscle/subcutaneous tissues  and interrupted 3-0 chromic sutures for the skin. The laceration was irrigated out before closure and dressed with neosporin after closure.  Direct laryngoscopy was performed using the anterior commissure laryngoscope. He was a  grade 1 view. The tongue base was normal but the posterior pharynx showed mild/moderate edema. The false vocal folds and piriform sinuses/arytenoid shows significant edema. The true vocal folds were grossly normal.  Rigid esophagoscopy was performed using the anterior commissure scope. This demonstrated a normal cervical esophagus. I placed a right nasogastric tube and secure it with tape.  The patient was turned back to anesthesia and awakened from anesthesia and had some peritracheal bleeding with coughing, so he was sedated and placed back on the ventilator and taken to the ICU in stable condition. The patient tolerated the procedure well with no immediate complications and was taken to the ICU in stable condition.   Dr. Melvenia Beam was present and performed the entire procedure. 06/14/2014  6:38 PM Melvenia Beam   Plan: would leave patient sedated for at least 48 hours to allow the tracheotomy to start to heal. ENT will change the trach to a 6 cuffless if he is stable on POD# 4 to 5, keep tracheotomy balloon inflated until then.

## 2014-06-14 NOTE — Anesthesia Procedure Notes (Signed)
Procedure Name: Awake intubation Date/Time: 06/14/2014 4:52 PM Performed by: Rise PatienceBELL, Kayleah Appleyard T Pre-anesthesia Checklist: Patient identified, Emergency Drugs available, Suction available and Patient being monitored Patient Re-evaluated:Patient Re-evaluated prior to inductionOxygen Delivery Method: Circle system utilized Preoxygenation: Pre-oxygenation with 100% oxygen Intubation Type: IV induction and Tracheostomy Placement Confirmation: positive ETCO2 and breath sounds checked- equal and bilateral Tube secured with: Tape Dental Injury: Teeth and Oropharynx as per pre-operative assessment

## 2014-06-14 NOTE — Transfer of Care (Signed)
Immediate Anesthesia Transfer of Care Note  Patient: Arthur Martin  Procedure(s) Performed: Procedure(s): TRACHEOSTOMY washout and repair of  neck laceration (N/A) PANENDOSCOPY (N/A) IRRIGATION AND DEBRIDEMENT EXTREMITY (N/A) PERCUTANEOUS PINNING EXTREMITY OF RIGHT SECOND AND THIRD METATARSALS (Right)  Patient Location: ICU  Anesthesia Type:General  Level of Consciousness: sedated, unresponsive and Patient remains intubated per anesthesia plan  Airway & Oxygen Therapy: Patient remains intubated per anesthesia plan and Patient placed on Ventilator (see vital sign flow sheet for setting)  Post-op Assessment: Report given to RN and Post -op Vital signs reviewed and stable  Post vital signs: Reviewed and stable  Last Vitals:  Filed Vitals:   06/14/14 1849  BP:   Pulse: 73  Resp: 15    Complications: No apparent anesthesia complications
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
## 2014-06-14 NOTE — ED Notes (Signed)
Report given to 3M 
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
## 2014-06-14 NOTE — ED Notes (Signed)
Paged Dr. Eulah PontMurphy @ 12:39

## 2014-06-15 ENCOUNTER — Encounter (HOSPITAL_COMMUNITY): Payer: Self-pay | Admitting: Otolaryngology

## 2014-06-15 LAB — TYPE AND SCREEN
ABO/RH(D): O POS
Antibody Screen: NEGATIVE
UNIT DIVISION: 0
UNIT DIVISION: 0

## 2014-06-15 LAB — BASIC METABOLIC PANEL
ANION GAP: 8 (ref 5–15)
BUN: 11 mg/dL (ref 6–20)
CO2: 24 mmol/L (ref 22–32)
Calcium: 8.6 mg/dL — ABNORMAL LOW (ref 8.9–10.3)
Chloride: 105 mmol/L (ref 101–111)
Creatinine, Ser: 1.52 mg/dL — ABNORMAL HIGH (ref 0.61–1.24)
GFR calc non Af Amer: 52 mL/min — ABNORMAL LOW (ref >60)
GLUCOSE: 109 mg/dL — AB (ref 65–99)
Potassium: 3.7 mmol/L (ref 3.5–5.1)
Sodium: 137 mmol/L (ref 135–145)

## 2014-06-15 LAB — CBC
HCT: 35.6 % — ABNORMAL LOW (ref 39.0–52.0)
Hemoglobin: 11.8 g/dL — ABNORMAL LOW (ref 13.0–17.0)
MCH: 29.1 pg (ref 26.0–34.0)
MCHC: 33.1 g/dL (ref 30.0–36.0)
MCV: 87.7 fL (ref 78.0–100.0)
Platelets: 254 10*3/uL (ref 150–400)
RBC: 4.06 MIL/uL — ABNORMAL LOW (ref 4.22–5.81)
RDW: 13.2 % (ref 11.5–15.5)
WBC: 12.5 10*3/uL — ABNORMAL HIGH (ref 4.0–10.5)

## 2014-06-15 LAB — GLUCOSE, CAPILLARY
GLUCOSE-CAPILLARY: 101 mg/dL — AB (ref 65–99)
GLUCOSE-CAPILLARY: 96 mg/dL (ref 65–99)
Glucose-Capillary: 104 mg/dL — ABNORMAL HIGH (ref 65–99)
Glucose-Capillary: 123 mg/dL — ABNORMAL HIGH (ref 65–99)

## 2014-06-15 LAB — PREPARE FRESH FROZEN PLASMA: Unit division: 0

## 2014-06-15 LAB — BLOOD PRODUCT ORDER (VERBAL) VERIFICATION

## 2014-06-15 MED ORDER — PIVOT 1.5 CAL PO LIQD
1000.0000 mL | ORAL | Status: DC
Start: 2014-06-15 — End: 2014-06-15
  Filled 2014-06-15 (×2): qty 1000

## 2014-06-15 MED ORDER — ENOXAPARIN SODIUM 40 MG/0.4ML ~~LOC~~ SOLN
40.0000 mg | SUBCUTANEOUS | Status: DC
  Administered 2014-06-15 – 2014-06-21 (×7): 40 mg via SUBCUTANEOUS
  Filled 2014-06-15 (×7): qty 0.4

## 2014-06-15 MED ORDER — CEFAZOLIN SODIUM-DEXTROSE 2-3 GM-% IV SOLR
2.0000 g | Freq: Two times a day (BID) | INTRAVENOUS | Status: DC
  Administered 2014-06-15 – 2014-06-21 (×14): 2 g via INTRAVENOUS
  Filled 2014-06-15 (×18): qty 50

## 2014-06-15 MED ORDER — SELENIUM 50 MCG PO TABS
200.0000 ug | ORAL_TABLET | Freq: Every day | ORAL | Status: AC
  Administered 2014-06-15 – 2014-06-21 (×7): 200 ug
  Filled 2014-06-15 (×7): qty 4

## 2014-06-15 MED ORDER — VITAMIN C 500 MG PO TABS
1000.0000 mg | ORAL_TABLET | Freq: Three times a day (TID) | ORAL | Status: AC
  Administered 2014-06-15 – 2014-06-21 (×21): 1000 mg
  Filled 2014-06-15 (×24): qty 2

## 2014-06-15 MED ORDER — PIVOT 1.5 CAL PO LIQD
1000.0000 mL | ORAL | Status: DC
  Administered 2014-06-15 – 2014-06-16 (×2): 1000 mL
  Filled 2014-06-15 (×6): qty 1000
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
## 2014-06-15 NOTE — Progress Notes (Signed)
     Subjective:  POD#1 I/D and percutaneous pinning of right foot. Patient reports pain as mild.  Intubated, resting comfortably in bed. Requesting to have the tube removed so that he can eat.   Objective:   VITALS:   Filed Vitals:   06/15/14 1200 06/15/14 1210 06/15/14 1300 06/15/14 1400  BP: 119/70  117/73 129/79  Pulse: 79  73 78  Temp:  100.3 F (37.9 C)    TempSrc:  Oral    Resp: 14  14 14   Height:      Weight:      SpO2: 100%  100% 100%    Neurologically intact ABD soft Neurovascular intact Sensation intact distally Intact pulses distally Incision: dressing C/D/I Splint to the RLE  Lab Results  Component Value Date   WBC 12.5* 06/15/2014   HGB 11.8* 06/15/2014   HCT 35.6* 06/15/2014   MCV 87.7 06/15/2014   PLT 254 06/15/2014   BMET    Component Value Date/Time   NA 137 06/15/2014 0218   K 3.7 06/15/2014 0218   CL 105 06/15/2014 0218   CO2 24 06/15/2014 0218   GLUCOSE 109* 06/15/2014 0218   BUN 11 06/15/2014 0218   CREATININE 1.52* 06/15/2014 0218   CALCIUM 8.6* 06/15/2014 0218   GFRNONAA 52* 06/15/2014 0218   GFRAA >60 06/15/2014 0218     Assessment/Plan: 1 Day Post-Op   Active Problems:   Open fracture of thyroid cartilage  NWB in the RLE DVT prophylaxis per trauma   Havoc Sanluis Marie 06/15/2014, 3:32 PM Cell [phone]
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
## 2014-06-15 NOTE — Anesthesia Postprocedure Evaluation (Signed)
  Anesthesia Post-op Note  Patient: Arthur Martin  Procedure(s) Performed: Procedure(s): TRACHEOSTOMY washout and repair of  neck laceration (N/A) PANENDOSCOPY (N/A) IRRIGATION AND DEBRIDEMENT EXTREMITY (N/A) PERCUTANEOUS PINNING EXTREMITY OF RIGHT SECOND AND THIRD METATARSALS (Right)  Patient Location: NICU  Anesthesia Type:General  Level of Consciousness: awake, alert  and oriented  Airway and Oxygen Therapy: Patient Spontanous Breathing and Patient connected to tracheostomy mask oxygen  Post-op Pain: mild  Post-op Assessment: Post-op Vital signs reviewed, Patient's Cardiovascular Status Stable, Respiratory Function Stable and Patent Airway  Post-op Vital Signs: stable  Last Vitals:  Filed Vitals:   06/15/14 0500  BP: 105/71  Pulse: 84  Temp:   Resp: 14    Complications: No apparent anesthesia complications
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
## 2014-06-16 ENCOUNTER — Inpatient Hospital Stay (HOSPITAL_COMMUNITY): Payer: Worker's Compensation

## 2014-06-16 LAB — CBC
HCT: 36.6 % — ABNORMAL LOW (ref 39.0–52.0)
Hemoglobin: 12.4 g/dL — ABNORMAL LOW (ref 13.0–17.0)
MCH: 30 pg (ref 26.0–34.0)
MCHC: 33.9 g/dL (ref 30.0–36.0)
MCV: 88.4 fL (ref 78.0–100.0)
Platelets: 263 10*3/uL (ref 150–400)
RBC: 4.14 MIL/uL — AB (ref 4.22–5.81)
RDW: 13.3 % (ref 11.5–15.5)
WBC: 11.3 10*3/uL — ABNORMAL HIGH (ref 4.0–10.5)

## 2014-06-16 LAB — GLUCOSE, CAPILLARY
GLUCOSE-CAPILLARY: 93 mg/dL (ref 65–99)
Glucose-Capillary: 98 mg/dL (ref 65–99)
Glucose-Capillary: 130 mg/dL — ABNORMAL HIGH (ref 65–99)
Glucose-Capillary: 124 mg/dL — ABNORMAL HIGH (ref 65–99)
Glucose-Capillary: 132 mg/dL — ABNORMAL HIGH (ref 65–99)

## 2014-06-16 MED ORDER — IOHEXOL 350 MG/ML SOLN
80.0000 mL | Freq: Once | INTRAVENOUS | Status: AC | PRN
  Administered 2014-06-16: 80 mL via INTRAVENOUS

## 2014-06-16 NOTE — Progress Notes (Signed)
     Subjective:  POD #2 I/D and percutaneous pinning of right foot. Patient reports pain as mild. Intubated but resting comfortably in bed.   Objective:   VITALS:   Filed Vitals:   06/16/14 0530 06/16/14 0600 06/16/14 0758 06/16/14 0800  BP:  110/69  113/65  Pulse: 88 82  76  Temp:   99.2 F (37.3 C)   TempSrc:   Oral   Resp: 20 14  14   Height:      Weight:      SpO2: 100% 100%  100%    Neurologically intact ABD soft Neurovascular intact Sensation intact distally Intact pulses distally Dorsiflexion/Plantar flexion intact Incision: dressing C/D/I R foot splinted  Lab Results  Component Value Date   WBC 11.3* 06/16/2014   HGB 12.4* 06/16/2014   HCT 36.6* 06/16/2014   MCV 88.4 06/16/2014   PLT 263 06/16/2014   BMET    Component Value Date/Time   NA 137 06/15/2014 0218   K 3.7 06/15/2014 0218   CL 105 06/15/2014 0218   CO2 24 06/15/2014 0218   GLUCOSE 109* 06/15/2014 0218   BUN 11 06/15/2014 0218   CREATININE 1.52* 06/15/2014 0218   CALCIUM 8.6* 06/15/2014 0218   GFRNONAA 52* 06/15/2014 0218   GFRAA >60 06/15/2014 0218     Assessment/Plan: 2 Days Post-Op   Active Problems:   Open fracture of thyroid cartilage  NWB in the RLE Up with PT once extubated  DVT prophylaxis per trauma  Arthur Martin 06/16/2014, 8:57 AM Cell [phone]

## 2014-06-16 NOTE — Progress Notes (Signed)
Subjective: POD#2 from awake tracheotomy, open reduction and internal fixation of left thyroid cartilage fracture, complex closure of neck laceration, and laryngoscopy/esophagoscopy. Stable, follow commands, awake.  Objective: Vital signs in last 24 hours: Temp:  [99.2 F (37.3 C)-100.6 F (38.1 C)] 99.4 F (37.4 C) (05/25 1209) Pulse Rate:  [75-97] 75 (05/25 1354) Resp:  [14-21] 15 (05/25 1354) BP: (105-138)/(60-91) 126/75 mmHg (05/25 1354) SpO2:  [98 %-100 %] 100 % (05/25 1354) FiO2 (%):  [40 %] 40 % (05/25 1354) Weight:  [73.3 kg (161 lb 9.6 oz)] 73.3 kg (161 lb 9.6 oz) (05/25 0500)  Neck supple, laceration clean dry and intact with absorbable sutures. 6 cuffed trach in place with cuff inflated and some dried sanguinous drainage around trach with velcro ties and silk sutures in place. Patient awake and watching TV. NGtube in right nasopharynx and secure with tape.  @LABLAST2 (wbc:2,hgb:2,hct:2,plt:2)  Recent Labs  06/14/14 1329 06/15/14 0218  NA 138 137  K 3.2* 3.7  CL 103 105  CO2 23 24  GLUCOSE 100* 109*  BUN 12 11  CREATININE 1.68* 1.52*  CALCIUM 9.3 8.6*    Medications:  Scheduled Meds: . sodium chloride   Intravenous Once  . antiseptic oral rinse  7 mL Mouth Rinse QID  .  ceFAZolin (ANCEF) IV  2 g Intravenous Q12H  . chlorhexidine  15 mL Mouth Rinse BID  . enoxaparin (LOVENOX) injection  40 mg Subcutaneous Q24H  . neomycin-bacitracin-polymyxin   Topical TID  . pantoprazole  40 mg Oral Daily   Or  . pantoprazole (PROTONIX) IV  40 mg Intravenous Daily  . selenium  200 mcg Per Tube Daily  . vitamin C  1,000 mg Per Tube 3 times per day   Continuous Infusions: . dextrose 5 % and 0.45 % NaCl with KCl 20 mEq/L 75 mL/hr at 06/16/14 0747  . feeding supplement (PIVOT 1.5 CAL) 1,000 mL (06/15/14 1602)  . fentaNYL infusion INTRAVENOUS 75 mcg/hr (06/16/14 0747)  . propofol (DIPRIVAN) infusion Stopped (06/15/14 0630)   PRN Meds:.HYDROmorphone (DILAUDID) injection,  HYDROmorphone (DILAUDID) injection, midazolam, ondansetron **OR** ondansetron (ZOFRAN) IV, Racepinephrine HCl  Assessment/Plan: Doing well POD#2 from ORIF left thyroid cartilage fracture, awake tracheotomy, complex closure neck laceration, and DL/esophagoscopy. Will change trach to 6 cuffless POD#4. Patient can be weaned to trach collar ad lib, will need home health for trach suction and trach supplies.   LOS: 2 days   Melvenia BeamGore, Keyari Kleeman 06/16/2014, 2:51 PM
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
## 2014-06-17 LAB — GLUCOSE, CAPILLARY
GLUCOSE-CAPILLARY: 133 mg/dL — AB (ref 65–99)
Glucose-Capillary: 74 mg/dL (ref 65–99)
Glucose-Capillary: 92 mg/dL (ref 65–99)

## 2014-06-17 MED ORDER — HYDROCODONE-ACETAMINOPHEN 7.5-325 MG/15ML PO SOLN
15.0000 mL | ORAL | Status: DC | PRN
  Administered 2014-06-17 – 2014-06-19 (×7): 15 mL
  Filled 2014-06-17 (×7): qty 15
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
## 2014-06-17 NOTE — Procedures (Signed)
PT removed from ATC and placed on the vent (PRVC 600 x14 40% PEEP 5) for night support.  RT will continue to monitor pt.  Pt resting at this time.
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
## 2014-06-17 NOTE — Progress Notes (Signed)
40 ML fentanyl wasted in sink with Pieter PartridgeSusannah Washburn RN.
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
## 2014-06-18 ENCOUNTER — Inpatient Hospital Stay (HOSPITAL_COMMUNITY): Payer: Worker's Compensation

## 2014-06-18 LAB — GLUCOSE, CAPILLARY
GLUCOSE-CAPILLARY: 107 mg/dL — AB (ref 65–99)
GLUCOSE-CAPILLARY: 122 mg/dL — AB (ref 65–99)
GLUCOSE-CAPILLARY: 134 mg/dL — AB (ref 65–99)
GLUCOSE-CAPILLARY: 140 mg/dL — AB (ref 65–99)
Glucose-Capillary: 137 mg/dL — ABNORMAL HIGH (ref 65–99)
Glucose-Capillary: 124 mg/dL — ABNORMAL HIGH (ref 65–99)
Glucose-Capillary: 114 mg/dL — ABNORMAL HIGH (ref 65–99)
Glucose-Capillary: 116 mg/dL — ABNORMAL HIGH (ref 65–99)

## 2014-06-18 MED ORDER — IPRATROPIUM-ALBUTEROL 0.5-2.5 (3) MG/3ML IN SOLN: 3.0000 mL | Freq: Four times a day (QID) | RESPIRATORY_TRACT | Status: DC | PRN

## 2014-06-18 NOTE — Procedures (Signed)
RT at pt bedside due to vent alarming.  Pt complained that he could not breathe and that there was too much pressure.  Vent changes made for pt's comfort.  [facility].  After change pt stated that he was able to breath again. Pt is know comfortable and resting well at this time.  RT will continue to monitor pt.

## 2014-06-18 NOTE — Progress Notes (Signed)
     Subjective:  POD#4 I/D and percutaneous pinning of the R foot. Patient reports pain as mild to moderate.  Resting comfortably in bed.   Objective:   VITALS:   Filed Vitals:   06/18/14 0300 06/18/14 0343 06/18/14 0400 06/18/14 0500  BP: 121/60  115/60 115/58  Pulse: 84  84 74  Temp:  99.8 F (37.7 C)    TempSrc:      Resp: 17  16 13   Height:      Weight:      SpO2: 97%  100% 100%    Neurologically intact ABD soft Neurovascular intact Sensation intact distally Intact pulses distally Incision: dressing C/D/I R leg splint intact  Lab Results  Component Value Date   WBC 11.3* 06/16/2014   HGB 12.4* 06/16/2014   HCT 36.6* 06/16/2014   MCV 88.4 06/16/2014   PLT 263 06/16/2014   BMET    Component Value Date/Time   NA 137 06/15/2014 0218   K 3.7 06/15/2014 0218   CL 105 06/15/2014 0218   CO2 24 06/15/2014 0218   GLUCOSE 109* 06/15/2014 0218   BUN 11 06/15/2014 0218   CREATININE 1.52* 06/15/2014 0218   CALCIUM 8.6* 06/15/2014 0218   GFRNONAA 52* 06/15/2014 0218   GFRAA >60 06/15/2014 0218     Assessment/Plan: 4 Days Post-Op   Active Problems:   Open fracture of thyroid cartilage  Up with PT NWB in the RLE DVT prophylaxis per trauma team Stable from ortho standpoint, will sign off for now and follow the patient in the outpatient clinic.  Please call with any questions or concerns.   Devon Kingdon Hilda LiasMarie 06/18/2014, 6:13 AM Cell [phone]

## 2014-06-18 NOTE — Progress Notes (Signed)
Orthopedic Tech Progress Note Patient Details:  Arthur NeatReggie Lamb [birth year] [id number]  Ortho Devices Type of Ortho Device: Crutches Ortho Device/Splint Interventions: Ordered   Asia Burnett KanarisR Thompson 06/18/2014, 2:18 PM

## 2014-06-18 NOTE — Care Management Note (Signed)
Case Management Note  Patient Details  Name: Arthur Martin MRN: [id number] Date of Birth: [birth year]  Subjective/Objective:    Pt progressing well; possible dc over the weekend.  Pt states his girlfriend works 12hr shifts, but is off Sat, Sun, and Mon.  Pt ambulating now on crutches, but per PT may need to use RW when by himself.  PT and OT recommending no OP follow up.    Pearlie OysterSherri Thomas with [facility] [employer] is Worker's Runner, broadcasting/film/videoComp Case Manager, and is overseeing pt's case.  Contact # for Roanna RaiderSherri is [phone].                Action/Plan: Pt will need HHRN and HH speech therapy at dc.  Respiratory therapist has performed trach teaching with pt, and he did well.  Bedside nurses to reinforce education with trach care.  Pt ambulated without O2 this afternoon and tolerated well; bedside nurse states pt tolerating room air with sats at 98%.   May not qualify or even need home oxygen.  Orders for all trach supplies, suction set up, RW , BSC, and HH care in computer.   [facility].  [facility], who [facility]. (fax # [phone]).   At this time, the name of [facility] agency is not known.  Will follow up when information is available.   Bedside nurse, Orpha BurKaty, ordered #4 shiley and ambu bag for pt to take home at dc, and ortho tech brought crutches for home, per PT recommendations.  Pt has order to transfer to [facility] later today.   Will need to notify Pearlie OysterSherri Thomas, pt's [facility] case manager when dc date is known.  Again, her phone # is [phone].    Expected Discharge Date:    06/18/14              Expected Discharge Plan:  Home w Home Health Services  In-House Referral:     Discharge planning Services  CM Consult  Post Acute Care Choice:  Home Health Choice offered to:  Patient  DME Arranged:  Bedside commode, Walker rolling, Crutches, Suction, Trach supplies, Other see comment, Oxygen DME Agency:  [facility]  HH Arranged:  RN, Speech Therapy HH Agency:     Status of Service:  In process, will continue to follow  Medicare Important Message Given:    Date Medicare IM Given:    Medicare IM give by:    Date Additional Medicare IM Given:    Additional Medicare Important Message give by:     If discussed at Long Length of Stay Meetings, dates discussed:    Additional Comments: Will ask weekend case manager to follow up with pt to finalize dc plans.    Quintella BatonJulie W. Saverio Kader, RN, BSN  Trauma/Neuro ICU Case Manager [phone]

## 2014-06-18 NOTE — Progress Notes (Signed)
Occupational Therapy Evaluation Patient Details Name: Arthur NeatReggie Martin MRN: [id number] DOB: 11-11-1965 Today's Date: 06/18/2014    History of Present Illness  49 year old who was apparently at work when a grinder broke and struck him in the neck and foot. presents with hoarseness, neck laceration, left thyroid cartilage fracture, and right hyoid fracture. tracheotomy, repair of neck laceration, possible repair of thyroid fracture, and direct laryngoscopy and esophagoscopy. Pt also s/p percutaneous pinning of the 2nd and 3rd metatarsals on the R foot.   Clinical Impression   PTA, pt independent with ADL and mobility. Began education regarding compensatory techniques for ADL. Pt will need a RW and 3 in 1 for safe D/C home. Will plan to see pt in am to address established goals. Pt will be able to D/C home safely with intermittent S.     Follow Up Recommendations  No OT follow up;Supervision - Intermittent    Equipment Recommendations  3 in 1 bedside comode;Other (comment) (RW)    Recommendations for Other Services       Precautions / Restrictions Precautions Precautions: Other (comment);Fall (trach) Restrictions RLE Weight Bearing: Non weight bearing      Mobility Bed Mobility Overal bed mobility: Modified Independent                Transfers Overall transfer level: Needs assistance   Transfers: Sit to/from Stand;Stand Pivot Transfers Sit to Stand: Supervision Stand pivot transfers: Min guard       General transfer comment: vc for hand placement    Balance Overall balance assessment: Needs assistance   Sitting balance-Leahy Scale: Normal     Standing balance support: During functional activity;Single extremity supported Standing balance-Leahy Scale: Fair                              ADL Overall ADL's : Needs assistance/impaired     Grooming: Set up   Upper Body Bathing: Set up;Sitting   Lower Body Bathing: Minimal assistance;Sit to/from  stand   Upper Body Dressing : Set up   Lower Body Dressing: Minimal assistance   Toilet Transfer: Min guard   Toileting- Clothing Manipulation and Hygiene: Min guard       Functional mobility during ADLs: Min guard;Rolling walker General ADL Comments: Discussed recommendation and use of 3 in 1 for bathing.                     Pertinent Vitals/Pain Pain Assessment: No/denies pain     Hand Dominance Right   Extremity/Trunk Assessment Upper Extremity Assessment Upper Extremity Assessment: Overall WFL for tasks assessed   Lower Extremity Assessment Lower Extremity Assessment: Defer to PT evaluation (s/p pinning R 3rd/4th metatarsals)   Cervical / Trunk Assessment Cervical / Trunk Assessment: Normal   Communication Communication Communication: Passy-Muir valve;Tracheostomy   Cognition Arousal/Alertness: Awake/alert Behavior During Therapy: WFL for tasks assessed/performed Overall Cognitive Status: Within Functional Limits for tasks assessed                     General Comments   Appreciative of help; managing secretions using suction. Coughing secretions up through mouth.                  Home Living Family/patient expects to be discharged to:: Private residence Living Arrangements: Non-relatives/Friends Available Help at Discharge: Family;Available PRN/intermittently Type of Home: Mobile home Home Access: Stairs to enter Entrance Stairs-Number of Steps: 5 Entrance Stairs-Rails: Right Home Layout:  One level     Bathroom Shower/Tub: Producer, television/film/video: Standard Bathroom Accessibility: Yes How Accessible: Accessible via walker Home Equipment: None          Prior Functioning/Environment Level of Independence: Independent             OT Diagnosis: Generalized weakness   OT Problem List: Impaired balance (sitting and/or standing);Decreased knowledge of use of DME or AE;Decreased knowledge of precautions;Increased edema  (neck edema)   OT Treatment/Interventions: Self-care/ADL training;DME and/or AE instruction;Therapeutic activities;Patient/family education;Balance training    OT Goals(Current goals can be found in the care plan section) Acute Rehab OT Goals Patient Stated Goal: to go home and back to work OT Goal Formulation: With patient Time For Goal Achievement: 06/25/14 Potential to Achieve Goals: Good ADL Goals Pt Will Perform Lower Body Bathing: with modified independence;sit to/from stand Pt Will Perform Lower Body Dressing: with modified independence;sit to/from stand Pt Will Transfer to Toilet: with modified independence;ambulating;bedside commode Pt Will Perform Toileting - Clothing Manipulation and hygiene: with modified independence;sit to/from stand Pt Will Perform Tub/Shower Transfer: with supervision;3 in 1;ambulating;Shower transfer;rolling walker  OT Frequency: Min 2X/week    End of Session Equipment Utilized During Treatment: Rolling walker;Oxygen Nurse Communication: Mobility status;Other (comment) (IV site)  Activity Tolerance: Patient tolerated treatment well Patient left: in chair;with call bell/phone within reach   Time: 1302-1326 OT Time Calculation (min): 24 min Charges:  OT General Charges $OT Visit: 1 Procedure OT Evaluation $Initial OT Evaluation Tier I: 1 Procedure OT Treatments $Self Care/Home Management : 8-22 mins G-Codes:    Marice Guidone,HILLARY June 24, 2014, 1:37 PM   [facility], OTR/L  [phone] 2014/06/24

## 2014-06-18 NOTE — Progress Notes (Signed)
Arthur Martin, Arthur Martin [id number] [birth year] Trauma Md, MD    HPI: POD#4 from ORIF of left thyroid cartilage fracture, awake tracheotomy, repair neck laceration, and direct laryngoscopy with esophagoscopy. Was stable during day on trach collar, has been stable since OR  Allergies: No Known Allergies  ROS: Review of systems + for neck pain, dysphagia, foot pain, otherwise negative x 12 systems except per HPI.  PMH: History reviewed. No pertinent past medical history.  FH: History reviewed. No pertinent family history.  SH:  History   Social History  . Marital Status: Single    Spouse Name: N/A  . Number of Children: N/A  . Years of Education: N/A   Occupational History  . Not on file.   Social History Main Topics  . Smoking status: Current Every Day Smoker  . Smokeless tobacco: Not on file  . Alcohol Use: No  . Drug Use: Not on file  . Sexual Activity: Not on file   Other Topics Concern  . Not on file   Social History Narrative    PSH:  Past Surgical History  Procedure Laterality Date  . Tracheostomy tube placement N/A 06/14/2014    Procedure: TRACHEOSTOMY washout and repair of  neck laceration;  Surgeon: Melvenia Beam, MD;  Location: [facility] OR;  Service: ENT;  Laterality: N/A;  . Panendoscopy N/A 06/14/2014    Procedure: PANENDOSCOPY;  Surgeon: Melvenia Beam, MD;  Location: [facility] OR;  Service: ENT;  Laterality: N/A;  . I&d extremity N/A 06/14/2014    Procedure: IRRIGATION AND DEBRIDEMENT EXTREMITY;  Surgeon: Sheral Apley, MD;  Location: [facility] OR;  Service: Orthopedics;  Laterality: N/A;  . Percutaneous pinning Right 06/14/2014    Procedure: PERCUTANEOUS PINNING EXTREMITY OF RIGHT SECOND AND THIRD METATARSALS;  Surgeon: Sheral Apley, MD;  Location: [facility] OR;  Service: Orthopedics;  Laterality: Right;    Physical  Exam: CN 2-12 grossly intact and symmetric. Awake and alert EAC/TMs normal BL. Oral cavity, lips, gums, ororpharynx normal with no masses or lesions. NGtube in right nasal  cavity. Skin warm and dry. Nasal cavity without polyps or purulence. External nose and ears without masses or lesions. EOMI, PERRLA. Neck with healing neck laceration and patent 6 cuffed Shiley with cuff up and dried sanguinous secretions around packing/trach.  Procedure Note: 31575 and 65784 laryngoscopy and trach change. Informed verbal consent was obtained after explaining the risks (including bleeding and infection), benefits and alternatives of the procedure. Verbal timeout was performed prior to the procedure. The 4mm flexible scope was advanced through the left nasal cavity. The septum and turbinates appeared normal. The middle meatus was free of polyps or purulence. The eustachian tube, choana, and adenoids were normal in appearance. The epiglottis was very edematous with submucosal hematoma on the superior and left sides of the epiglottis. The false vocal folds are very edematous with inspissated secretions sitting above the larynx obscuring the view of the true vocal folds. The NGtube is coursing into the esophageal inlet. The patient tolerated the procedure with no immediate complications.  I removed the trach dressing (surgicel), sutures, and trach velcro ties. I deflated the trach cuff and removed his old 6 cuffed trach, cleaned it, and set it aside. I then placed his new 6 cuffless trach in the tracheostomy under direct visualization and placed the new inner cannula and resecured the velcro trach ties. Flexible tracheoscopy showed the trach in normal position well above the carina with a normal trachea/mainstem bronchi. Patient tolerated the procedure well with no immediate complications.  A/P: s/p ORIF left thyroid cartilage fracture, awake tracheotomy, repair neck laceration, DL/esophagoscopy. Changed to 6 cuffless trach but larynx is still too edematous to safely decannulate. Will plan to leave 6 cuffless in place, and patient can see me back in the office in 2-3 weeks after discharge. If his  laryngeal edema has resolved and larynx is clearly visible at repeat flexible laryngoscopy can possibly decannulate in the office. If he has persistent laryngeal edema/obstruction will [facility] to see if he needs revision direct laryngoscopy and/or revision laryngoplasty/tracheoplasty. Can have speech see him to do swallow study and can start diet if he can swallow safely. If he is cleared for a diet by speech can remove his right nasogastric tube PRN. Will need to go home with pain medicine, antibiotic for total of 14 days, topical neosporin to neck laceration, and will need home health for trach supplies, suction, etc. Can give racemic epinephrine nebs PRN for his coughing.   Melvenia BeamGore, Tonnette Zwiebel 06/18/2014 6:58 AM

## 2014-06-18 NOTE — Progress Notes (Signed)
OT Cancellation Note  Patient Details Name: Arthur NeatReggie Sabbagh MRN: [id number] DOB: [birth year]   Cancelled Treatment:    Reason Eval/Treat Not Completed: Patient at procedure or test/ unavailable (MBS)  [facility],HILLARY  Brizza Nathanson, OTR/L  [phone] 06/18/2014 06/18/2014, 11:02 AM

## 2014-06-18 NOTE — Evaluation (Signed)
Passy-Muir Speaking Valve - Evaluation Patient Details  Name: Arthur NeatReggie Joos MRN: [id number] Date of Birth: 12/10/1965  Today's Date: 06/18/2014 Time: 1030-1115 SLP Time Calculation (min) (ACUTE ONLY): 45 min  Past Medical History: History reviewed. No pertinent past medical history. Past Surgical History:  Past Surgical History  Procedure Laterality Date  . Tracheostomy tube placement N/A 06/14/2014    Procedure: TRACHEOSTOMY washout and repair of  neck laceration;  Surgeon: Melvenia BeamMitchell Gore, MD;  [facility];  Service: ENT;  Laterality: N/A;  . Panendoscopy N/A 06/14/2014    Procedure: PANENDOSCOPY;  Surgeon: Melvenia BeamMitchell Gore, MD;  [facility];  Service: ENT;  Laterality: N/A;  . I&d extremity N/A 06/14/2014    Procedure: IRRIGATION AND DEBRIDEMENT EXTREMITY;  Surgeon: Sheral Apleyimothy D Murphy, MD;  [facility];  Service: Orthopedics;  Laterality: N/A;  . Percutaneous pinning Right 06/14/2014    Procedure: PERCUTANEOUS PINNING EXTREMITY OF RIGHT SECOND AND THIRD METATARSALS;  Surgeon: Sheral Apleyimothy D Murphy, MD;  Location: [facility] OR;  Service: Orthopedics;  Laterality: Right;   HPI:  Pt admitted after grinder accident with neck laceration with fx of hyoid bone and left thyroid cartilage, toe fx. Pt now s/p ORIF of thyroid cartilage and trach (5/23).   Assessment / Plan / Recommendation Clinical Impression  Pt presents with excellent toleration of PMV with sufficient access to upper airway despite edema.  Sp02 remained > 98%; RR13; HR 82.  Pt reports improved comfort with use of valve; less coughing.  Phonation hoarse, mildly decreased volume, but speech is fully intelligible.  Educated pt re: function of valve and taught how to place/remove valve - pt able to do so independently.  Recommend pt use PMV during all waking hours.  D/w RN.     SLP Assessment  Patient needs continued Speech Lanaguage Pathology Services    Follow Up Recommendations  Home health SLP    Frequency and Duration min 2x/week  1  week       SLP Goals Potential to Achieve Goals (ACUTE ONLY): Good   PMSV Trial  PMSV was placed for: 45 minutes Able to redirect subglottic air through upper airway: Yes Able to Attain Phonation: Yes Voice Quality: Hoarse Able to Expectorate Secretions: Yes Level of Secretion Expectoration with PMSV: Oral Breath Support for Phonation: Adequate Intelligibility: Intelligible Respirations During Trial: 13 SpO2 During Trial: 100 % Pulse During Trial: 82 Behavior: Alert;Cooperative;Expresses self well   Tracheostomy Tube    #6 cuffless   Vent Dependency  FiO2 (%): 28 %    Cuff Deflation Trial   cuffless  Blenda MountsCouture, Ashlynd Michna Laurice 06/18/2014, 12:13 PM

## 2014-06-18 NOTE — Progress Notes (Signed)
Received call from [facility]:  They state that [facility] to [facility] [place] cannot see pt until Tuesday, May 31st, due to [facility].  It would be best practice for pt to have [facility], due to new tracheostomy.   Notified M. Leotis ShamesJeffery, PA of this.  Pt will likely not be dc until Monday or Tuesday...would encourage bedside nurses to reinforce trach teaching with pt and girlfriend, if available.  Still uncertain which agency will follow up with pt.    Updated Pearlie OysterSherri Thomas, pt's case manager for [employer] on these events.  She will follow over the weekend and on Monday.  Please call her if any issues arise with discharge or dc planning.  Phone [phone].   I will update weekend Case Mgr of these events.  Will plan to follow up on Monday.    Quintella BatonJulie W. Aracelis Ulrey, RN, BSN  Trauma/Neuro ICU Case Manager [phone]

## 2014-06-18 NOTE — Progress Notes (Signed)
Patient ID: Arthur Martin, male   DOB: [birth year], 49 y.o.   MRN: [id number] 4 Days Post-Op  Subjective: Trach changed to cuffless #6 by Dr. Emeline Martin this AM. Some productive cough.  Objective: Vital signs in last 24 hours: Temp:  [98.7 F (37.1 C)-99.8 F (37.7 C)] 99.8 F (37.7 C) (05/27 0343) Pulse Rate:  [70-106] 104 (05/27 0700) Resp:  [13-22] 17 (05/27 0700) BP: (109-131)/(48-100) 130/100 mmHg (05/27 0700) SpO2:  [97 %-100 %] 97 % (05/27 0700) FiO2 (%):  [40 %] 40 % (05/27 0700) Weight:  [71.5 kg (157 lb 10.1 oz)] 71.5 kg (157 lb 10.1 oz) (05/27 0500) Last BM Date: 06/13/14  Intake/Output from previous day: 05/26 0701 - 05/27 0700 In: 3220 [I.V.:1800; NG/GT:1320; IV Piggyback:100] Out: 2400 [Urine:2400] Intake/Output this shift:    General appearance: cooperative Neck: trach in place, clear secretions Resp: clear to auscultation bilaterally Cardio: regular rate and rhythm GI: soft. NT Extremities: splint RLE  Lab Results: CBC   Recent Labs  06/16/14 0251  WBC 11.3*  HGB 12.4*  HCT 36.6*  PLT 263   BMET No results for input(s): NA, K, CL, CO2, GLUCOSE, BUN, CREATININE, CALCIUM in the last 72 hours. PT/INR No results for input(s): LABPROT, INR in the last 72 hours. ABG No results for input(s): PHART, HCO3 in the last 72 hours.  Invalid input(s): PCO2, PO2  Studies/Results: No results found.  Anti-infectives: Anti-infectives    Start     Dose/Rate Route Frequency Ordered Stop   06/15/14 1000  ceFAZolin (ANCEF) IVPB 2 g/50 mL premix     2 g 100 mL/hr over 30 Minutes Intravenous Every 12 hours 06/15/14 0741 06/29/14 0959   06/14/14 2000  ceFAZolin (ANCEF) IVPB 2 g/50 mL premix  Status:  Discontinued     2 g 100 mL/hr over 30 Minutes Intravenous Every 6 hours 06/14/14 1848 06/15/14 0741   06/14/14 1415  ceFAZolin (ANCEF) IVPB 2 g/50 mL premix     2 g 100 mL/hr over 30 Minutes Intravenous  Once 06/14/14 1406 06/14/14 1538   06/14/14 1343  ceFAZolin (ANCEF)  2-3 GM-% IVPB SOLR    Comments:  Arthur Martin   : cabinet override      06/14/14 1343 06/15/14 0159      Assessment/Plan: [name] Neck laceration with FX hyoid bone and L thyroid cartilage - S/P ORIF thyroid cartiladge and trach by Dr. Emeline Martin. Dr. Emeline Martin changed to cuffless #6 trach this AM R 2,3 MT FX, 4th proximal phalanx toe FX - S/P washout and perc pinning by Dr. Eulah Martin. NWB RLE. Resp - has done well on HTC, add Duonebs PRN ID - Ancef, total 14d ABX per Dr. Emeline Martin. Plan change to oral prior to D/C. FEN - TF. ST evaluation ongoing. Allow ice and will need MBS per ST. ABL anemia - mild, check in AM VTE - Lovenox DIspo - PT/OT, continue ICU for resp monitoring  LOS: 4 days    Arthur GelinasBurke Quamere Mussell, MD, MPH, FACS Trauma: [phone] General Surgery: [phone]  06/18/2014

## 2014-06-18 NOTE — Progress Notes (Signed)
Physical Therapy Treatment Patient Details Name: Arthur Martin MRN: [id number] DOB: 12-22-1965 Today's Date: 06/18/2014    History of Present Illness  49 year old who was apparently at work when a grinder broke and struck him in the neck and foot. presents with hoarseness, neck laceration, left thyroid cartilage fracture, and right hyoid fracture. tracheotomy, repair of neck laceration, possible repair of thyroid fracture, and direct laryngoscopy and esophagoscopy. Pt also s/p percutaneous pinning of the 2nd and 3rd metatarsals on the R foot.    PT Comments    Progressing well as expected.  All mobility assessed and safe enough to go home with available assist.  Follow Up Recommendations  No PT follow up     Equipment Recommendations  Rolling walker with 5" wheels;Crutches;3in1 (PT)    Recommendations for Other Services       Precautions / Restrictions Precautions Precautions: Fall Precaution Comments: minimal risk Restrictions RLE Weight Bearing: Non weight bearing    Mobility  Bed Mobility Overal bed mobility: Modified Independent                Transfers Overall transfer level: Modified independent Equipment used: Rolling walker (2 wheeled);Crutches Transfers: Sit to/from Stand Sit to Stand: Modified independent (Device/Increase time)         General transfer comment: cues/demo for safe transfer technique with RW and crutches  Ambulation/Gait Ambulation/Gait assistance: Supervision;Modified independent (Device/Increase time) (supervision with crutches, Mod I with RW once lines d/c'd) Ambulation Distance (Feet): 200 Feet Assistive device: Crutches;Rolling walker (2 wheeled)     Gait velocity interpretation: at or above normal speed for age/gender General Gait Details: stable "swing to " gait, more unsteady with crutches   Stairs Stairs: Yes Stairs assistance: Min guard Stair Management: One rail Left;With crutches Number of Stairs: 5 General stair  comments: smooth execution once technique explained.  Wheelchair Mobility    Modified Rankin (Stroke Patients Only)       Balance Overall balance assessment: No apparent balance deficits (not formally assessed)   Sitting balance-Leahy Scale: Normal       Standing balance-Leahy Scale: Good                      Cognition Arousal/Alertness: Awake/alert Behavior During Therapy: WFL for tasks assessed/performed Overall Cognitive Status: Within Functional Limits for tasks assessed                      Exercises      General Comments        Pertinent Vitals/Pain Pain Assessment: Faces Faces Pain Scale: Hurts a little bit Pain Location: leg Pain Descriptors / Indicators: Sore Pain Intervention(s): Monitored during session    Home Living                      Prior Function            PT Goals (current goals can now be found in the care plan section) Acute Rehab PT Goals Patient Stated Goal: to go home and back to work PT Goal Formulation: With patient Time For Goal Achievement: 07/01/14 Potential to Achieve Goals: Good Progress towards PT goals: Progressing toward goals    Frequency  Min 3X/week    PT Plan Current plan remains appropriate    Co-evaluation             End of Session   Activity Tolerance: Patient tolerated treatment well Patient left: in chair;with call bell/phone within reach  Time: [id number] PT Time Calculation (min) (ACUTE ONLY): 25 min  Charges:  $Gait Training: 8-22 mins $Therapeutic Activity: 8-22 mins                    G Codes:      Mohd. Derflinger, Eliseo GumKenneth V 06/18/2014, 5:10 PM 06/18/2014  [place] BingKen Maguadalupe Lata, PT [phone]  (pager)

## 2014-06-18 NOTE — Procedures (Signed)
Pt taken off vent and placed on 40% ATC.  HR-83, RR-15, and SpO2-100.

## 2014-06-18 NOTE — Progress Notes (Signed)
Trach teaching performed.  Patient did well.  Booklet provided.  Discussed with RN to follow up with teaching regarding trach care and suctioning.

## 2014-06-18 NOTE — Progress Notes (Signed)
Speech Pathology: MBS results -  CHL IP CLINICAL IMPRESSIONS 06/18/2014  Therapy Diagnosis Mild pharyngeal phase dysphagia  Clinical Impression Pt presented with edematous hypopharynx, as well as presence of large bore NG, but swallow is remarkably functional.   Pt was using PMV during assessement.  He presented with occasional, trace aspiration of mechanical solids and penetration of thin liquids.  Sensation is intact,  and spontaneous cough was effective in ejecting aspirate from subglottal trachea.  Recommend removal of NG; initiate full liquid diet (due to easier transition through pharynx); meds crushed;  pt must use PMV during all PO consumption.  Videofluoroscopy was viewed in real time and results/recs were discussed with pt, who is in agreement.  SLP will follow for education  - pt for potential D/C home next date.

## 2014-06-19 LAB — CBC
HEMATOCRIT: 37.3 % — AB (ref 39.0–52.0)
HEMOGLOBIN: 12.7 g/dL — AB (ref 13.0–17.0)
MCH: 29.7 pg (ref 26.0–34.0)
MCHC: 34 g/dL (ref 30.0–36.0)
MCV: 87.4 fL (ref 78.0–100.0)
Platelets: 336 10*3/uL (ref 150–400)
RBC: 4.27 MIL/uL (ref 4.22–5.81)
RDW: 12.6 % (ref 11.5–15.5)
WBC: 10.4 10*3/uL (ref 4.0–10.5)

## 2014-06-19 LAB — GLUCOSE, CAPILLARY
Glucose-Capillary: 98 mg/dL (ref 65–99)
Glucose-Capillary: 116 mg/dL — ABNORMAL HIGH (ref 65–99)
Glucose-Capillary: 94 mg/dL (ref 65–99)
Glucose-Capillary: 122 mg/dL — ABNORMAL HIGH (ref 65–99)

## 2014-06-19 NOTE — Progress Notes (Signed)
Speech Language Pathology Treatment: Dysphagia;Passy Muir Speaking valve  Patient Details Name: Arthur Martin MRN: [id number] DOB: [birth year] Today's Date: 06/19/2014 Time: [id number] SLP Time Calculation (min) (ACUTE ONLY): 11 min  Assessment / Plan / Recommendation Clinical Impression  Pt seen for f/u treatment for PMV and dysphagia. Pt has been wearing PMV during all waking hours and tolerated it well under direct SLP observation today. Voice remains mildly hoarse. He can verbalize previously presented information about PMV maintenance and care with Mod I, and dons/doffs the valve without assistance.   No overt signs of aspiration were observed with small cup sips of water, although pt does report that he has had frequent coughing with meals. Per MBS on previous date, coughing is effective at clearing penetrates, however frequent coughing still serves as a contraindication for further diet advancement at this time. Educated pt about current recommendations and likely advancement/resolution of dysphagia as he continues to heal. No further questions voiced.    HPI Other Pertinent Information: Pt admitted after grinder accident with neck laceration with fx of hyoid bone and left thyroid cartilage, toe fx. Pt now s/p ORIF of thyroid cartilage and trach (5/23).   Pertinent Vitals Pain Assessment: Faces Faces Pain Scale: No hurt  SLP Plan  Continue with current plan of care    Recommendations Diet recommendations: Thin liquid (full liquids) Liquids provided via: Cup;Straw Medication Administration: Crushed with puree Supervision: Patient able to self feed;Intermittent supervision to cue for compensatory strategies Compensations: Small sips/bites;Slow rate;Clear throat intermittently Postural Changes and/or Swallow Maneuvers: Seated upright 90 degrees      Patient may use Passy-Muir Speech Valve: During all waking hours (remove during sleep);During PO intake/meals PMSV Supervision: Intermittent        Oral Care Recommendations: Oral care BID Follow up Recommendations: Home health SLP Plan: Continue with current plan of care    Arthur Martin, Arthur Martin [phone]  Arthur Hamaiewonsky, Erin Uecker 06/19/2014, 12:08 PM

## 2014-06-19 NOTE — Progress Notes (Signed)
In to see pt for OT session. Explained to him who I was an what activities I would like to see him do and he gave me this odd look. So I asked if he thought he was going to have any issues with B/D, getting to/from or up/down from toilet, in/out of shower to sit<>stand from 3n1 and he shook his head no. I did make sure that he was aware that he could not get the pins in his toes wet nor get water down his trach and he shook his head yes. No further needs identified, we will sign off. Ignacia PalmaCathy Casmere [place], [place] [phone] 06/19/2014

## 2014-06-19 NOTE — Progress Notes (Signed)
Called and left a message for the weekend case manager about pt really wanting to go home today if at all possible.  Asked her to take a look at note from Raynelle FanningJulie, trauma case manager from 06/18/14.
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
## 2014-06-20 LAB — GLUCOSE, CAPILLARY
GLUCOSE-CAPILLARY: 87 mg/dL (ref 65–99)
Glucose-Capillary: 116 mg/dL — ABNORMAL HIGH (ref 65–99)
Glucose-Capillary: 95 mg/dL (ref 65–99)
Glucose-Capillary: 105 mg/dL — ABNORMAL HIGH (ref 65–99)
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
## 2014-06-21 LAB — GLUCOSE, CAPILLARY
GLUCOSE-CAPILLARY: 96 mg/dL (ref 65–99)
GLUCOSE-CAPILLARY: 106 mg/dL — AB (ref 65–99)
Glucose-Capillary: 101 mg/dL — ABNORMAL HIGH (ref 65–99)
Glucose-Capillary: 96 mg/dL (ref 65–99)
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
## 2014-06-21 NOTE — Progress Notes (Signed)
Patient removed, cleaned, and put back in inner cannula of trach  with minimal assistance and prompting. Patient state that he felt comfortable with caring for his trach.

## 2014-06-21 NOTE — Progress Notes (Signed)
SATURATION QUALIFICATIONS: (This note is used to comply with regulatory documentation for home oxygen)  Patient Saturations on Room Air at Rest = 98% ra  Patient Saturations on Room Air while Ambulating = 100%ra  Patient Saturations on 0 Liters of oxygen while Ambulating = 100% ra  Please briefly explain why patient needs home oxygen:

## 2014-06-22 DIAGNOSIS — Y9269 Other specified industrial and construction area as the place of occurrence of the external cause: Secondary | ICD-10-CM

## 2014-06-22 DIAGNOSIS — S92911B Unspecified fracture of right toe(s), initial encounter for open fracture: Secondary | ICD-10-CM | POA: Diagnosis present

## 2014-06-22 DIAGNOSIS — S92301B Fracture of unspecified metatarsal bone(s), right foot, initial encounter for open fracture: Secondary | ICD-10-CM | POA: Diagnosis present

## 2014-06-22 DIAGNOSIS — D62 Acute posthemorrhagic anemia: Secondary | ICD-10-CM | POA: Diagnosis not present

## 2014-06-22 DIAGNOSIS — S128XXA Fracture of other parts of neck, initial encounter: Secondary | ICD-10-CM | POA: Diagnosis present

## 2014-06-22 LAB — GLUCOSE, CAPILLARY: GLUCOSE-CAPILLARY: 94 mg/dL (ref 65–99)

## 2014-06-22 MED ORDER — HYDROCODONE-ACETAMINOPHEN 7.5-325 MG/15ML PO SOLN: 15.0000 mL | ORAL | Status: DC | PRN
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
## 2014-06-22 NOTE — Progress Notes (Signed)
Arthur Martin to be D/C'd Home per MD order.  Discussed with the patient and all questions fully answered.  VSS. Home Health set up prior to discharge. IV catheter discontinued intact. Site without signs and symptoms of complications. Dressing and pressure applied.  An After Visit Summary was printed and given to the patient. Patient received prescription.  D/c education completed with patient/family including follow up instructions, medication list, d/c activities limitations if indicated, with other d/c instructions as indicated by MD - patient able to verbalize understanding, all questions fully answered.   Patient instructed to return to ED, call 911, or call MD for any changes in condition.   Patient escorted via WC, and D/C home via private auto.  Leodis BinetCook, Brianny Soulliere D 06/22/2014 10:00 AM
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
## 2014-06-22 NOTE — Discharge Summary (Signed)
Physician Discharge Summary  Patient ID: Arthur NeatReggie Martin MRN: [id number] DOB/AGE: 51/02/1965 51 y.o.  Admit date: 06/14/2014 Discharge date: 06/22/2014  Discharge Diagnoses Patient Active Problem List   Diagnosis Date Noted  . Open fracture of hyoid bone 06/22/2014  . Multiple open fractures of metatarsal bone of right foot 06/22/2014  . Industrial accident 06/22/2014  . Open fracture of phalanx of toe of right foot 06/22/2014  . Acute blood loss anemia 06/22/2014  . Open fracture of thyroid cartilage 06/14/2014    Consultants Dr. Melvenia BeamMitchell Gore for ENT  Dr. Margarita Ranaimothy Murphy for orthopedic surgery   Procedures 5/23 -- Awake tracheostomy, direct laryngoscopy, cervical esophagoscopy, complex repair of 4cm neck laceration, and open reduction and internal fixation of thyroid cartilage fracture by Dr. Emeline DarlingGore  5/23 -- I&D and ORIF of open right foot fractures by Dr. 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 One portion flew into his right foot. The other piece struck him in the right neck. He came in as a level one trauma. He complained of hoarseness to his voice and pain when speaking. He also complained of right foot pain. His workup included CT angiography of his neck and right foot x-rays which showed the above-mentioned injuries. He was taken to the OR for a combined procedure as listed above.   Hospital Course: Following surgery ENT recommended he be kept sedated and on the ventilator for 48 hours. Once that time period passed he was able to be weaned from the ventilator quickly. He was seen by physical, occupational, and speech therapies and progressed well. By the time of discharge he was tolerating a limited diet and using his Passy-Muir valve well. He was ready to go by 5/27 but his worker's compensation carrier could not arrange home health for the weekend so he was kept for several extra days and then discharged home in good  condition.     Medication List    TAKE these medications        HYDROcodone-acetaminophen 7.5-325 mg/15 ml solution  Commonly known as:  HYCET  Take 15 mLs by mouth every 4 (four) hours as needed for moderate pain.            Follow-up Information    Schedule an appointment as soon as possible for a visit with Sheral ApleyMURPHY, TIMOTHY D, MD.   Specialty:  Orthopedic Surgery   Contact information:   [address] [place] [id number] [phone]       Schedule an appointment as soon as possible for a visit with Melvenia BeamGore, Mitchell, MD.   Specialty:  Otolaryngology   Contact information:   [address] [place] [address] [phone]       Call [facility].   Why:  As needed   Contact information:   [address] [place] [id number] [phone]       Signed: Freeman CaldronMichael J. Quinto Tippy, PA-C Pager: [phone] General Trauma PA Pager: [phone] 06/22/2014, 9:35 AM

## 2014-06-25 NOTE — Addendum Note (Signed)
Addendum  created 06/25/14 1115 by Laverle HobbyGregory Tajuanna Burnett, MD   Modules edited: Anesthesia Responsible Staff

## 2014-06-28 ENCOUNTER — Ambulatory Visit (HOSPITAL_COMMUNITY)
Admission: RE | Admit: 2014-06-28 | Discharge: 2014-06-28 | Disposition: A | Discharge status: Home / Self Care | Payer: Worker's Compensation | Source: Ambulatory Visit | Attending: Cardiology | Admitting: Cardiology

## 2014-06-28 ENCOUNTER — Emergency Department (HOSPITAL_COMMUNITY)
Admission: EM | Admit: 2014-06-28 | Discharge: 2014-06-28 | Disposition: A | Discharge status: Home / Self Care | Payer: Worker's Compensation

## 2014-06-28 ENCOUNTER — Other Ambulatory Visit (HOSPITAL_COMMUNITY): Payer: Self-pay | Admitting: Orthopedic Surgery

## 2014-06-28 DIAGNOSIS — R609 Edema, unspecified: Secondary | ICD-10-CM

## 2014-06-28 DIAGNOSIS — I82491 Acute embolism and thrombosis of other specified deep vein of right lower extremity: Secondary | ICD-10-CM | POA: Insufficient documentation

## 2014-06-28 NOTE — Progress Notes (Signed)
   Patient ID: Arthur Martin, male    DOB: Oct 09, 1965, 49 y.o.   MRN: [id number]  HPI    Review of Systems    Physical Exam

## 2014-06-28 NOTE — Progress Notes (Addendum)
Preliminary results be tech - Right Lower Ext. Venous Duplex Completed. Positive for acute deep vein thrombosis involving the peroneal vein in the right leg. Results given to Brittney, Dr. Greig RightMurphy's PA, and the patient was instructed to report to [facility].  Marilynne Halstedita Teaghan Melrose, BS, RDMS, RVT

## 2014-06-30 ENCOUNTER — Encounter: Payer: Self-pay | Admitting: Internal Medicine

## 2014-06-30 ENCOUNTER — Ambulatory Visit (INDEPENDENT_AMBULATORY_CARE_PROVIDER_SITE_OTHER): Payer: Worker's Compensation | Admitting: Internal Medicine

## 2014-06-30 ENCOUNTER — Other Ambulatory Visit: Payer: Self-pay | Admitting: *Deleted

## 2014-06-30 ENCOUNTER — Telehealth: Payer: Self-pay | Admitting: Internal Medicine

## 2014-06-30 VITALS — BP 108/70 | HR 84 | Ht 70.0 in | Wt 158.7 lb

## 2014-06-30 DIAGNOSIS — I82459 Acute embolism and thrombosis of unspecified peroneal vein: Secondary | ICD-10-CM | POA: Insufficient documentation

## 2014-06-30 DIAGNOSIS — I82401 Acute embolism and thrombosis of unspecified deep veins of right lower extremity: Secondary | ICD-10-CM

## 2014-06-30 MED ORDER — RIVAROXABAN (XARELTO) VTE STARTER PACK (15 & 20 MG): ORAL_TABLET | ORAL | Status: DC

## 2014-06-30 NOTE — Progress Notes (Signed)
OFFICE NOTE  Chief Complaint:  New DVT, right leg pain  Primary Care Physician: No primary care provider on file.  HPI:  Arthur Martin is a pleasant 49 year old male who is calm be referred to me for evaluation of right leg pain. He recently had industrial trauma and head tracheolaryngeal damages as well as fractures to his right foot. He was hospitalized for a period of time and intubated. He subsequently was discharged and developed worsening right leg pain. He was sent for a venous ultrasound performed in our office on 06/28/2014 which showed DVT which was acute of the right peroneal vein. He is noted swelling and tenderness particularly of the leg when walking. The foot is in a walking cast and he does have crutches. He also had a tracheotomy which is healing. He was given a prescription by the trauma PA on call for Lovenox injections. He's been taking those for the last couple of days but did not take a dose this morning. We discussed options for management of DVT but feel that Xarelto would be the best choice. I did discuss the possible side effects of Xarelto including increased bleeding risk and side effects including rash. We also discussed his increased risk of developing post thrombotic syndrome, which can be up to 40%. To combat this, would be advised that he wear compression stocking to the right lower extremity.  PMHx:  No past medical history on file.  Past Surgical History  Procedure Laterality Date  . Tracheostomy tube placement N/A 06/14/2014    Procedure: TRACHEOSTOMY washout and repair of  neck laceration;  Surgeon: Melvenia BeamMitchell Gore, MD;  [facility];  Service: ENT;  Laterality: N/A;  . Panendoscopy N/A 06/14/2014    Procedure: PANENDOSCOPY;  Surgeon: Melvenia BeamMitchell Gore, MD;  [facility];  Service: ENT;  Laterality: N/A;  . I&d extremity N/A 06/14/2014    Procedure: IRRIGATION AND DEBRIDEMENT EXTREMITY;  Surgeon: Sheral Apleyimothy D Murphy, MD;  [facility];  Service:  Orthopedics;  Laterality: N/A;  . Percutaneous pinning Right 06/14/2014    Procedure: PERCUTANEOUS PINNING EXTREMITY OF RIGHT SECOND AND THIRD METATARSALS;  Surgeon: Sheral Apleyimothy D Murphy, MD;  Location: [facility] OR;  Service: Orthopedics;  Laterality: Right;    FAMHx:  No family history on file. Both parents deceased, no family history of DVT  SOCHx:   reports that he has been smoking.  He does not have any smokeless tobacco history on file. He reports that he does not drink alcohol. His drug history is not on file.  ALLERGIES:  No Known Allergies  ROS: A comprehensive review of systems was negative except for: Ears, nose, mouth, throat, and face: positive for voice change and Tracheotomy Musculoskeletal: positive for right leg and foot pain  HOME MEDS: Current Outpatient Prescriptions  Medication Sig Dispense Refill  . diazepam (VALIUM) 5 MG tablet Take 5 mg by mouth at bedtime as needed. Take 1 tab daily at bedtime  0  . Enoxaparin Sodium (LOVENOX [place]) Inject into the skin every 12 (twelve) hours.    Marland Kitchen. HYDROcodone-acetaminophen (HYCET) 7.5-325 mg/15 ml solution Take 15 mLs by mouth every 4 (four) hours as needed for moderate pain. 120 mL 0   No current facility-administered medications for this visit.    LABS/IMAGING: No results found for this or any previous visit (from the past 48 hour(s)). No results found.  WEIGHTS: Wt Readings from Last 3 Encounters:  06/30/14 158 lb 11.2 oz (71.986 kg)  06/22/14 158 lb 14.4 oz (72.077  kg)    VITALS: BP 108/70 mmHg  Pulse 84  Ht  (1.778 m)  Wt 158 lb 11.2 oz (71.986 kg)  BMI 22.77 kg/m2  EXAM: General appearance: alert and no distress Neck: no carotid bruit, no JVD and Covered tracheotomy Lungs: clear to auscultation bilaterally Heart: regular rate and rhythm, S1, S2 normal, no murmur, click, rub or gallop Extremities: Right foot is bandaged in a walking cast, there is trace edema to the right lower extremity and no edema of the  left lower extremity, the right leg is tender to palpation mostly anteriorly  EKG: Deferred  ASSESSMENT: 1. Acute right peroneal vein DVT, secondary to trauma  PLAN: 1.   Arthur Martin has acute right peroneal vein DVT secondary to trauma. This is despite subcutaneous heparin treatment while hospitalized. He will need to be on full dose anticoagulation. Currently he is on Lovenox however treatment and compliance will be improved on a direct oral anticoagulant. I'm recommending Xarelto with a DVT starter pack including the higher twice-daily dosing and then decreasing to single daily dosing for. A 6 months. We'll plan to repeat an ultrasound in 6 months and I'll see him back at that time. In the meantime I'm recommending a compression stocking to the right lower extremity of 20-30 mmHg to prevent post-thrombotic syndrome. He is complaining of some significant right leg pain and feels that his current pain medication is not adequate. I deferred further pain management to his orthopedist.  Thanks for the kind referral.  Chrystie Nose, MD, [facility] Attending Cardiologist [facility]  Chrystie Nose 06/30/2014, 10:31 AM

## 2014-06-30 NOTE — Telephone Encounter (Signed)
Spoke with nurse case Production designer, theatre/television/filmmanager.   [facility] [place] [facility] not [facility] and cannot get it until tomorrow afternoon. Offered to send in to a different pharmacy.   [facility] [place].   Nurse asked to have office note faxed to her. She will send over copy of signed release from patient that gives OK to send her PHI.

## 2014-06-30 NOTE — Patient Instructions (Addendum)
Medication Instructions:   STOP lovenox (when you get your xarelto prescription) >> take your first dose of xarelto 15mg  12 hours after your last dose of lovenox  TAKE xarelto 15mg  twice daily with food for 21 days. On Day 22, start taking xarelto 20mg  once daily (with biggest meal of day)  Labwork:  NONE  Testing/Procedures:  Right lower extremity venous doppler in 6 months  20-2430mmHg right lower extremity stocking  Follow-Up:  6 months

## 2014-07-01 NOTE — Addendum Note (Signed)
Addended by: Lindell Spar on: 07/01/2014 12:01 PM   Modules accepted: Orders

## 2014-07-01 NOTE — Telephone Encounter (Signed)
Case manager stopped by office today. She obtained copy of patient's office note.

## 2014-07-15 ENCOUNTER — Telehealth (HOSPITAL_COMMUNITY): Payer: Self-pay | Admitting: *Deleted

## 2014-07-21 ENCOUNTER — Telehealth: Payer: Self-pay | Admitting: Internal Medicine

## 2014-07-21 DIAGNOSIS — I82401 Acute embolism and thrombosis of unspecified deep veins of right lower extremity: Secondary | ICD-10-CM

## 2014-07-21 MED ORDER — RIVAROXABAN 20 MG PO TABS: 20.0000 mg | ORAL_TABLET | Freq: Every day | ORAL | Status: DC

## 2014-07-21 NOTE — Telephone Encounter (Signed)
Refill sent - called back and left message to inform caller.

## 2014-07-21 NOTE — Telephone Encounter (Signed)
°  1. Which medications need to be refilled?Xarelto 20mg   2. Which pharmacy [facility] [place]   3. Do they need a 30 day or 90 day supply? 90  4. Would they like a call back once the medication has been sent to the pharmacy? yes

## 2014-09-30 ENCOUNTER — Other Ambulatory Visit: Payer: Self-pay | Admitting: *Deleted

## 2014-10-01 ENCOUNTER — Other Ambulatory Visit: Payer: Self-pay | Admitting: Pharmacist Clinician (PhC)/ Clinical Pharmacy Specialist

## 2014-11-30 ENCOUNTER — Encounter: Payer: Self-pay | Admitting: Internal Medicine

## 2015-01-03 ENCOUNTER — Ambulatory Visit: Payer: Self-pay | Admitting: Internal Medicine

## 2015-01-12 ENCOUNTER — Telehealth: Payer: Self-pay | Admitting: Internal Medicine

## 2015-01-12 ENCOUNTER — Other Ambulatory Visit (HOSPITAL_COMMUNITY): Payer: Self-pay | Admitting: Otolaryngology

## 2015-01-12 NOTE — Telephone Encounter (Signed)
Call received from Arthur Martin's case manager.  Arthur Martin has scheduled surgery w/ ENT on 1/13. Repair of tracheal fistula.  Surgery center calling to seek clarification on Xarelto - to find out if Arthur Martin would be able to discontinue prior to the surgery.  Arthur Martin has his f/u w/ Dr. Rennis GoldenHilty on 1/10. Arthur Martin was seen in June and started on Xarelto because of a DVT. If Arthur Martin gets clearance to d/c Xarelto on 10th, will this be enough time or does Arthur Martin need to have surgery date postponed?  Explained 3 day hold should suffice for Xarelto, would route to Dr. Rennis GoldenHilty for any other considerations. Caller aware.

## 2015-01-12 NOTE — Telephone Encounter (Signed)
Cordelia PenSherry is calling back to speak with Harrold DonathNathan

## 2015-01-12 NOTE — Telephone Encounter (Signed)
Returned call, she likely called bc I called earlier today - attempted to get information. Need name of surgeon, other clearance info for clearance letter purposes. This can be completed in the surgical clearance request form by clinical or non-clinical staff.

## 2015-01-12 NOTE — Telephone Encounter (Signed)
Py is going to have surgery,need to know what to do about his Xareltp.

## 2015-01-12 NOTE — Telephone Encounter (Signed)
Arthur Martin called back in stating that Arthur Martin left a message on her VM stating that a non clinical staff member could take the information needed for the pt's surgical clearance.  Arthur Martin said that the ENT surgeon that will be performing the surgery will be Dr. Melvenia BeamMitchell Gore on 1/13. He will be performing a Tracheal Fistula repair. And the pt is also on Xarelto so she would like to know when the pt can begin to hold his Noel ChristmasXarleto and for how long.  She would like this information faxed back to her at [phone].   Please f/u with her Thanks

## 2015-01-19 NOTE — Telephone Encounter (Signed)
Arthur Martin will need a repeat venous doppler of the right lower extremity to evaluate for resolution of his DVT (preferably this week). If it has resolved, he can discontinue Xarelto permanently and would be able to undergo his fistula repair. If it is still present, he cannot stop Xarelto at this time and surgery would have to be postponed until the DVT has resolved. Since it has been greater than 6 months, I suspect the DVT will have resolved, but I would recommend the ultrasound to confirm this.  Thanks.  Dr. Rennis GoldenHilty

## 2015-01-20 NOTE — Telephone Encounter (Signed)
Left message to call back for [facility] [place] Case Manager

## 2015-01-21 NOTE — Telephone Encounter (Signed)
Spoke with Cordelia PenSherry, she stated pt has doppler scheduled 1/5 and wanted to know if she needed to be with pt as he was going to see MD that day. Informed her that MD appt was on the 10th and that pt will just get doppler done on 1/5.  Pt to have procedure on 1/13 and wanted to know if we can inform him/her of Xarelto stopping after doppler assessment for DVT. Informed her that results will be sent to MD and he will review and if the results are available before 1/10 app then that question can be addressed then. Told her I would forward to MD and his nurse to address when results are in.  Verbalized understanding, no questions at this time.

## 2015-01-27 ENCOUNTER — Ambulatory Visit (HOSPITAL_COMMUNITY)
Admission: RE | Admit: 2015-01-27 | Discharge: 2015-01-27 | Disposition: A | Discharge status: Home / Self Care | Payer: Worker's Compensation | Source: Ambulatory Visit | Attending: Internal Medicine | Admitting: Internal Medicine

## 2015-01-27 DIAGNOSIS — I82401 Acute embolism and thrombosis of unspecified deep veins of right lower extremity: Secondary | ICD-10-CM | POA: Insufficient documentation

## 2015-01-29 NOTE — Pre-Procedure Instructions (Signed)
Kekai Harrold DonathCrump  01/29/2015     Your procedure is scheduled on January 13.  [facility] at 6:30 A.M.  Call this number if you have problems the morning of surgery:  [phone]   Remember:  Do not eat food or drink liquids after midnight.  Take these medicines the morning of surgery with A SIP OF WATER None   Follow instructions given by Dr. Rennis GoldenHilty or Dr. Emeline DarlingGore regarding Xarelto   STOP/ Do not take Aspirin, Aleve, Naproxen, Advil, Ibuprofen, Motrin, Vitamins, Herbs, or Supplements starting today   Do not wear jewelry, make-up or nail polish.  Do not wear lotions, powders, or perfumes.  You may wear deodorant.  Do not shave 48 hours prior to surgery.  Men may shave face and neck.  Do not bring valuables to the hospital.  [facility] is not responsible for any belongings or valuables.  Contacts, dentures or bridgework may not be worn into surgery.  Leave your suitcase in the car.  After surgery it may be brought to your room.  For patients admitted to the hospital, discharge time will be determined by your treatment team.  Patients discharged the day of surgery will not be allowed to drive home.   [place] - Preparing for Surgery  Before surgery, you can play an important role.  Because skin is not sterile, your skin needs to be as free of germs as possible.  You can reduce the number of germs on you skin by washing with CHG (chlorahexidine gluconate) soap before surgery.  CHG is an antiseptic cleaner which kills germs and bonds with the skin to continue killing germs even after washing.  Please DO NOT use if you have an allergy to CHG or antibacterial soaps.  If your skin becomes reddened/irritated stop using the CHG and inform your nurse when you arrive at Short Stay.  Do not shave (including legs and underarms) for at least 48 hours prior to the first CHG shower.  You may shave your face.  Please follow these instructions carefully:   1.  Shower  with CHG Soap the night before surgery and the morning of Surgery.  2.  If you choose to wash your hair, wash your hair first as usual with your normal shampoo.  3.  After you shampoo, rinse your hair and body thoroughly to remove the shampoo.  4.  Use CHG as you would any other liquid soap.  You can apply CHG directly to the skin and wash gently with scrungie or a clean washcloth.  5.  Apply the CHG Soap to your body ONLY FROM THE NECK DOWN.  Do not use on open wounds or open sores.  Avoid contact with your eyes, ears, mouth and genitals (private parts).  Wash genitals (private parts) with your normal soap.  6.  Wash thoroughly, paying special attention to the area where your surgery will be performed.  7.  Thoroughly rinse your body with warm water from the neck down.  8.  DO NOT shower/wash with your normal soap after using and rinsing off the CHG Soap.  9.  Pat yourself dry with a clean towel.            10.  Wear clean pajamas.            11.  Place clean sheets on your bed the night of your first shower and do not sleep with pets.  Day of Surgery  Do not  apply any lotions the morning of surgery.  Please wear clean clothes to the hospital/surgery center.  Please read over the following fact sheets that you were given. Pain Booklet, Coughing and Deep Breathing and Surgical Site Infection Prevention

## 2015-01-31 ENCOUNTER — Encounter (HOSPITAL_COMMUNITY)
Admission: RE | Admit: 2015-01-31 | Discharge: 2015-01-31 | Disposition: A | Discharge status: Home / Self Care | Payer: Worker's Compensation | Source: Ambulatory Visit | Attending: Otolaryngology | Admitting: Otolaryngology

## 2015-01-31 ENCOUNTER — Encounter (HOSPITAL_COMMUNITY): Payer: Self-pay

## 2015-01-31 DIAGNOSIS — Z01812 Encounter for preprocedural laboratory examination: Secondary | ICD-10-CM | POA: Insufficient documentation

## 2015-01-31 DIAGNOSIS — J9509 Other tracheostomy complication: Secondary | ICD-10-CM | POA: Diagnosis not present

## 2015-01-31 HISTORY — DX: Depression, unspecified: F32.A

## 2015-01-31 HISTORY — DX: Major depressive disorder, single episode, unspecified: F32.9

## 2015-01-31 HISTORY — DX: Acute embolism and thrombosis of unspecified deep veins of unspecified lower extremity: I82.409

## 2015-01-31 LAB — COMPREHENSIVE METABOLIC PANEL
ALBUMIN: 3.9 g/dL (ref 3.5–5.0)
ALK PHOS: 92 U/L (ref 38–126)
ALT: 12 U/L — ABNORMAL LOW (ref 17–63)
AST: 18 U/L (ref 15–41)
Anion gap: 9 (ref 5–15)
BILIRUBIN TOTAL: 0.3 mg/dL (ref 0.3–1.2)
BUN: 9 mg/dL (ref 6–20)
CALCIUM: 9.5 mg/dL (ref 8.9–10.3)
CO2: 23 mmol/L (ref 22–32)
CREATININE: 1.47 mg/dL — AB (ref 0.61–1.24)
Chloride: 108 mmol/L (ref 101–111)
GFR calc Af Amer: >60 mL/min (ref >60)
GFR, EST NON AFRICAN AMERICAN: 54 mL/min — AB (ref >60)
GLUCOSE: 90 mg/dL (ref 65–99)
POTASSIUM: 4 mmol/L (ref 3.5–5.1)
Sodium: 140 mmol/L (ref 135–145)
TOTAL PROTEIN: 7.1 g/dL (ref 6.5–8.1)

## 2015-01-31 LAB — CBC
HEMATOCRIT: 42.4 % (ref 39.0–52.0)
HEMOGLOBIN: 14.6 g/dL (ref 13.0–17.0)
MCH: 30.2 pg (ref 26.0–34.0)
MCHC: 34.4 g/dL (ref 30.0–36.0)
MCV: 87.8 fL (ref 78.0–100.0)
Platelets: 400 10*3/uL (ref 150–400)
RBC: 4.83 MIL/uL (ref 4.22–5.81)
RDW: 13.5 % (ref 11.5–15.5)
WBC: 11.3 10*3/uL — AB (ref 4.0–10.5)

## 2015-01-31 NOTE — H&P (Signed)
02/04/2015  Arthur Martin, Arthur Martin  PREOPERATIVE HISTORY AND PHYSICAL  CHIEF COMPLAINT: persistent tracheostomy fistula  HISTORY: This is a 50 year old who underwent tracheotomy and complex repair of a thyroid cartilage and cricoid fracture with neck soft tissue injury. 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  He now presents for closure of tracheocutaneous fistula.  Dr. Emeline DarlingGore, Clovis RileyMitchell has discussed the risks (bleeding, infection, persistent fistula, anesthesia risks, neck emphysema/crepitance, etc.), benefits, and alternatives of this procedure. The patient understands the risks and would like to proceed with the procedure. The chances of success of the procedure are >25% and the patient understands this. I personally performed an examination of the patient within 24 hours of the procedure.  PAST MEDICAL HISTORY: Past Medical History  Diagnosis Date  . DVT (deep venous thrombosis) (HCC)     RT CALF    . Depression     PAST SURGICAL HISTORY: Past Surgical History  Procedure Laterality Date  . Tracheostomy tube placement N/A 06/14/2014    Procedure: TRACHEOSTOMY washout and repair of  neck laceration;  Surgeon: Arthur BeamMitchell Muriel Hannold, MD;  [facility];  Service: ENT;  Laterality: N/A;  . Panendoscopy N/A 06/14/2014    Procedure: PANENDOSCOPY;  Surgeon: Arthur BeamMitchell Donyea Beverlin, MD;  [facility];  Service: ENT;  Laterality: N/A;  . I&d extremity N/A 06/14/2014    Procedure: IRRIGATION AND DEBRIDEMENT EXTREMITY;  Surgeon: Sheral Apleyimothy D Murphy, MD;  [facility];  Service: Orthopedics;  Laterality: N/A;  . Percutaneous pinning Right 06/14/2014    Procedure: PERCUTANEOUS PINNING EXTREMITY OF RIGHT SECOND AND THIRD METATARSALS;  Surgeon: Sheral Apleyimothy D Murphy, MD;  Location: [facility] OR;  Service: Orthopedics;  Laterality: Right;    MEDICATIONS: No current facility-administered medications on file prior to encounter.   Current  Outpatient Prescriptions on File Prior to Encounter  Medication Sig Dispense Refill  . rivaroxaban (XARELTO) 20 MG TABS tablet Take 1 tablet (20 mg total) by mouth daily with supper. 90 tablet 3    ALLERGIES: No Known Allergies  SOCIAL HISTORY: Social History   Social History  . Marital Status: Single    Spouse Name: N/A  . Number of Children: N/A  . Years of Education: N/A   Occupational History  . Not on file.   Social History Main Topics  . Smoking status: Current Every Day Smoker  . Smokeless tobacco: Not on file  . Alcohol Use: Yes     Comment: OCC  . Drug Use: Yes    Special: Marijuana     Comment: LAST WEEK  . Sexual Activity: Not on file   Other Topics Concern  . Not on file   Social History Narrative    FAMILY HISTORY: No family history on file.  REVIEW OF SYSTEMS:  HEENT: neck scar/tracheocutaneous fistula, otherwise negative x 12 systems except per HPI   PHYSICAL EXAM:  GENERAL:  NAD VITAL SIGNS:    Filed Vitals:   02/04/15 0647  BP: 110/84  Pulse: 64  Temp: 97.6 F (36.4 C)  Resp: 20  SKIN:  HEENT: NECK: well-healed neck scar from previous neck injury and laceration repair, small patent pinpoint midline tracheocutaneous fistula   ABDOMEN:  soft MUSCULOSKELETAL: normal strength PSYCH:  Normal affect NEUROLOGIC:  CN 2-12 intact and symmetric   ASSESSMENT AND PLAN: Plan to proceed with complex repair of midline tracheocutaneous fistula. Patient understands the risks, benefits, and alternatives. Informed written consent signed, witnessed, and on chart. 02/04/15 Arthur Martin, [name]

## 2015-02-01 ENCOUNTER — Ambulatory Visit (INDEPENDENT_AMBULATORY_CARE_PROVIDER_SITE_OTHER): Payer: Worker's Compensation | Admitting: Internal Medicine

## 2015-02-01 ENCOUNTER — Encounter: Payer: Self-pay | Admitting: Internal Medicine

## 2015-02-01 VITALS — BP 104/76 | HR 70 | Ht 65.0 in | Wt 159.5 lb

## 2015-02-01 DIAGNOSIS — I82401 Acute embolism and thrombosis of unspecified deep veins of right lower extremity: Secondary | ICD-10-CM | POA: Diagnosis not present

## 2015-02-01 DIAGNOSIS — I82491 Acute embolism and thrombosis of other specified deep vein of right lower extremity: Secondary | ICD-10-CM

## 2015-02-01 DIAGNOSIS — I82451 Acute embolism and thrombosis of right peroneal vein: Secondary | ICD-10-CM

## 2015-02-01 NOTE — Progress Notes (Signed)
OFFICE NOTE  Chief Complaint:  Follow-up DVT  Primary Care Physician: No PCP Per Patient  HPI:  Arthur Martin is a pleasant 50 year old male who is calm be referred to me for evaluation of right leg pain. He recently had industrial trauma and head tracheolaryngeal damages as well as fractures to his right foot. He was hospitalized for a period of time and intubated. He subsequently was discharged and developed worsening right leg pain. He was sent for a venous ultrasound performed in our office on 06/28/2014 which showed DVT which was acute of the right peroneal vein. He is noted swelling and tenderness particularly of the leg when walking. The foot is in a walking cast and he does have crutches. He also had a tracheotomy which is healing. He was given a prescription by the trauma PA on call for Lovenox injections. He's been taking those for the last couple of days but did not take a dose this morning. We discussed options for management of DVT but feel that Xarelto would be the best choice. I did discuss the possible side effects of Xarelto including increased bleeding risk and side effects including rash. We also discussed his increased risk of developing post thrombotic syndrome, which can be up to 40%. To combat this, would be advised that he wear compression stocking to the right lower extremity.  Arthur Martin returns today for follow-up. He underwent repeat Doppler ultrasound of the right lower extremity which shows total resolution of his DVT. It's been greater than 6 months of anticoagulation on Xarelto. He is planning on having an ENT procedure later this week.  PMHx:  Past Medical History  Diagnosis Date  . DVT (deep venous thrombosis) (HCC)     RT CALF    . Depression     Past Surgical History  Procedure Laterality Date  . Tracheostomy tube placement N/A 06/14/2014    Procedure: TRACHEOSTOMY washout and repair of  neck laceration;  Surgeon: Ruby Cola, MD;  Location: [facility] OR;   Service: ENT;  Laterality: N/A;  . Panendoscopy N/A 06/14/2014    Procedure: PANENDOSCOPY;  Surgeon: Ruby Cola, MD;  Location: [place];  Service: ENT;  Laterality: N/A;  . I&d extremity N/A 06/14/2014    Procedure: IRRIGATION AND DEBRIDEMENT EXTREMITY;  Surgeon: Renette Butters, MD;  Location: [place];  Service: Orthopedics;  Laterality: N/A;  . Percutaneous pinning Right 06/14/2014    Procedure: PERCUTANEOUS PINNING EXTREMITY OF RIGHT SECOND AND THIRD METATARSALS;  Surgeon: Renette Butters, MD;  Location: [place];  Service: Orthopedics;  Laterality: Right;    FAMHx:  No family history on file. Both parents deceased, no family history of DVT  SOCHx:   reports that he has been smoking.  He does not have any smokeless tobacco history on file. He reports that he drinks alcohol. He reports that he uses illicit drugs (Marijuana).  ALLERGIES:  No Known Allergies  ROS: A comprehensive review of systems was negative except for: Ears, nose, mouth, throat, and face: positive for voice change and Tracheotomy Musculoskeletal: positive for right leg and foot pain  HOME MEDS: No current outpatient prescriptions on file.   No current facility-administered medications for this visit.    LABS/IMAGING: Results for orders placed or performed during the hospital encounter of 01/31/15 (from the past 48 hour(s))  CBC     Status: Abnormal   Collection Time: 01/31/15  9:35 AM  Result Value Ref Range   WBC 11.3 (H) 4.0 - 10.5 K/uL  RBC 4.83 4.22 - 5.81 MIL/uL   Hemoglobin 14.6 13.0 - 17.0 g/dL   HCT 42.4 39.0 - 52.0 %   MCV 87.8 78.0 - 100.0 fL   MCH 30.2 26.0 - 34.0 pg   MCHC 34.4 30.0 - 36.0 g/dL   RDW 13.5 11.5 - 15.5 %   Platelets 400 150 - 400 K/uL  Comprehensive metabolic panel     Status: Abnormal   Collection Time: 01/31/15  9:35 AM  Result Value Ref Range   Sodium 140 135 - 145 mmol/L   Potassium 4.0 3.5 - 5.1 mmol/L   Chloride 108 101 - 111 mmol/L   CO2 23 22 - 32 mmol/L   Glucose,  Bld 90 65 - 99 mg/dL   BUN 9 6 - 20 mg/dL   Creatinine, Ser 1.47 (H) 0.61 - 1.24 mg/dL   Calcium 9.5 8.9 - 10.3 mg/dL   Total Protein 7.1 6.5 - 8.1 g/dL   Albumin 3.9 3.5 - 5.0 g/dL   AST 18 15 - 41 U/L   ALT 12 (L) 17 - 63 U/L   Alkaline Phosphatase 92 38 - 126 U/L   Total Bilirubin 0.3 0.3 - 1.2 mg/dL   GFR calc non Af Amer 54 (L) >60 mL/min   GFR calc Af Amer >60 >60 mL/min    Comment: (NOTE) The eGFR has been calculated using the CKD EPI equation. This calculation has not been validated in all clinical situations. eGFR's persistently <60 mL/min signify possible Chronic Kidney Disease.    Anion gap 9 5 - 15   No results found.  WEIGHTS: Wt Readings from Last 3 Encounters:  02/01/15 159 lb 8 oz (72.349 kg)  01/31/15 156 lb 15.5 oz (71.2 kg)  06/30/14 158 lb 11.2 oz (71.986 kg)    VITALS: BP 104/76 mmHg  Pulse 70  Ht 5' 5"  (1.651 m)  Wt 159 lb 8 oz (72.349 kg)  BMI 26.54 kg/m2  EXAM: General appearance: alert and no distress Neck: no carotid bruit, no JVD and Covered tracheotomy Lungs: clear to auscultation bilaterally Heart: regular rate and rhythm, S1, S2 normal, no murmur, click, rub or gallop Extremities: Right foot is bandaged in a walking cast, there is trace edema to the right lower extremity and no edema of the left lower extremity, the right leg is tender to palpation mostly anteriorly  EKG: Deferred  ASSESSMENT: 1. Acute right peroneal vein DVT - resolved   PLAN: 1.   Arthur Martin had an acute right peroneal vein DVT secondary to trauma which is now resolved. I have no problem with him going ahead with fistula repair. He can discontinue his Zarrella toe. This should be completely out of his system prior to his procedure on Friday. There is no need for ongoing anticoagulation with aspirin.   Follow-up with me on an as-needed basis. Please do hesitate to contact me if you have any further questions.  Pixie Casino, MD, [facility] Attending Cardiologist Dent 02/01/2015, 10:45 AM

## 2015-02-01 NOTE — Patient Instructions (Signed)
Medication Instructions:  Your physician has recommended you make the following change in your medication:  1) STOP Xarelto   Labwork: none  Testing/Procedures: none  Follow-Up: Follow up with Dr. Rennis GoldenHilty as needed.    Any Other Special Instructions Will Be Listed Below (If Applicable).     If you need a refill on your cardiac medications before your next appointment, please call your pharmacy.

## 2015-02-04 ENCOUNTER — Ambulatory Visit (HOSPITAL_COMMUNITY)
Admission: RE | Admit: 2015-02-04 | Discharge: 2015-02-04 | Disposition: A | Discharge status: Home / Self Care | Payer: Worker's Compensation | Source: Ambulatory Visit | Attending: Otolaryngology | Admitting: Otolaryngology

## 2015-02-04 ENCOUNTER — Ambulatory Visit (HOSPITAL_COMMUNITY): Payer: Worker's Compensation | Admitting: Emergency Medicine

## 2015-02-04 ENCOUNTER — Encounter (HOSPITAL_COMMUNITY)
Admission: RE | Disposition: A | Discharge status: Home / Self Care | Payer: Self-pay | Source: Ambulatory Visit | Attending: Otolaryngology

## 2015-02-04 ENCOUNTER — Encounter (HOSPITAL_COMMUNITY): Payer: Self-pay | Admitting: *Deleted

## 2015-02-04 ENCOUNTER — Ambulatory Visit (HOSPITAL_COMMUNITY): Payer: Worker's Compensation | Admitting: Certified Registered Nurse Anesthetist

## 2015-02-04 DIAGNOSIS — Y838 Other surgical procedures as the cause of abnormal reaction of the patient, or of later complication, without mention of misadventure at the time of the procedure: Secondary | ICD-10-CM | POA: Insufficient documentation

## 2015-02-04 DIAGNOSIS — J9509 Other tracheostomy complication: Secondary | ICD-10-CM | POA: Diagnosis not present

## 2015-02-04 DIAGNOSIS — F17200 Nicotine dependence, unspecified, uncomplicated: Secondary | ICD-10-CM | POA: Diagnosis not present

## 2015-02-04 DIAGNOSIS — Z7901 Long term (current) use of anticoagulants: Secondary | ICD-10-CM | POA: Insufficient documentation

## 2015-02-04 HISTORY — PX: TRACHEOESOPHAGEAL FISTULA REPAIR: SHX2557

## 2015-02-04 LAB — PROTIME-INR
INR: 1.41 (ref 0.00–1.49)
Prothrombin Time: 17.4 seconds — ABNORMAL HIGH (ref 11.6–15.2)

## 2015-02-04 SURGERY — TRACHEAL ESOPHAGEAL FISTULA REPAIR
Anesthesia: General | Site: Neck

## 2015-02-04 MED ORDER — MIDAZOLAM HCL 5 MG/5ML IJ SOLN
INTRAMUSCULAR | Status: DC | PRN
  Administered 2015-02-04: 2 mg via INTRAVENOUS

## 2015-02-04 MED ORDER — PHENYLEPHRINE HCL 10 MG/ML IJ SOLN
INTRAMUSCULAR | Status: DC | PRN
  Administered 2015-02-04 (×2): 120 ug via INTRAVENOUS
  Administered 2015-02-04 (×2): 240 ug via INTRAVENOUS

## 2015-02-04 MED ORDER — HYDROMORPHONE HCL 1 MG/ML IJ SOLN
INTRAMUSCULAR | Status: AC
  Administered 2015-02-04: 0.5 mg via INTRAVENOUS
  Filled 2015-02-04: qty 1

## 2015-02-04 MED ORDER — SUGAMMADEX SODIUM 200 MG/2ML IV SOLN
INTRAVENOUS | Status: DC | PRN
  Administered 2015-02-04: 200 mg via INTRAVENOUS

## 2015-02-04 MED ORDER — LACTATED RINGERS IV SOLN
INTRAVENOUS | Status: DC
  Administered 2015-02-04 (×2): via INTRAVENOUS

## 2015-02-04 MED ORDER — PROMETHAZINE HCL 25 MG/ML IJ SOLN: 6.2500 mg | INTRAMUSCULAR | Status: DC | PRN

## 2015-02-04 MED ORDER — ROCURONIUM BROMIDE 100 MG/10ML IV SOLN
INTRAVENOUS | Status: DC | PRN
  Administered 2015-02-04: 30 mg via INTRAVENOUS

## 2015-02-04 MED ORDER — SUCCINYLCHOLINE CHLORIDE 20 MG/ML IJ SOLN
INTRAMUSCULAR | Status: DC | PRN
  Administered 2015-02-04: 100 mg via INTRAVENOUS

## 2015-02-04 MED ORDER — SUGAMMADEX SODIUM 200 MG/2ML IV SOLN
INTRAVENOUS | Status: AC
  Filled 2015-02-04: qty 2

## 2015-02-04 MED ORDER — LIDOCAINE HCL (CARDIAC) 20 MG/ML IV SOLN
INTRAVENOUS | Status: DC | PRN
  Administered 2015-02-04: 50 mg via INTRATRACHEAL
  Administered 2015-02-04: 50 mg via INTRAVENOUS

## 2015-02-04 MED ORDER — FENTANYL CITRATE (PF) 100 MCG/2ML IJ SOLN
INTRAMUSCULAR | Status: DC | PRN
  Administered 2015-02-04: 100 ug via INTRAVENOUS

## 2015-02-04 MED ORDER — ONDANSETRON HCL 4 MG/2ML IJ SOLN
INTRAMUSCULAR | Status: DC | PRN
  Administered 2015-02-04: 4 mg via INTRAVENOUS

## 2015-02-04 MED ORDER — PROPOFOL 10 MG/ML IV BOLUS
INTRAVENOUS | Status: DC | PRN
  Administered 2015-02-04: 80 mg via INTRAVENOUS

## 2015-02-04 MED ORDER — DEXAMETHASONE SODIUM PHOSPHATE 10 MG/ML IJ SOLN
10.0000 mg | Freq: Once | INTRAMUSCULAR | Status: AC
Start: 2015-02-04 — End: 2015-02-04
  Administered 2015-02-04: 10 mg via INTRAVENOUS
  Filled 2015-02-04: qty 1

## 2015-02-04 MED ORDER — HYDROMORPHONE HCL 1 MG/ML IJ SOLN
0.2500 mg | INTRAMUSCULAR | Status: DC | PRN
  Administered 2015-02-04 (×2): 0.5 mg via INTRAVENOUS

## 2015-02-04 MED ORDER — MIDAZOLAM HCL 2 MG/2ML IJ SOLN
INTRAMUSCULAR | Status: AC
  Filled 2015-02-04: qty 2

## 2015-02-04 MED ORDER — PROPOFOL 10 MG/ML IV BOLUS
INTRAVENOUS | Status: AC
  Filled 2015-02-04: qty 20

## 2015-02-04 MED ORDER — GLYCOPYRROLATE 0.2 MG/ML IJ SOLN
INTRAMUSCULAR | Status: DC | PRN
  Administered 2015-02-04: 0.2 mg via INTRAVENOUS

## 2015-02-04 MED ORDER — CLINDAMYCIN PHOSPHATE 900 MG/50ML IV SOLN
900.0000 mg | Freq: Once | INTRAVENOUS | Status: AC
  Administered 2015-02-04: 900 mg via INTRAVENOUS
  Filled 2015-02-04 (×2): qty 50

## 2015-02-04 MED ORDER — 0.9 % SODIUM CHLORIDE (POUR BTL) OPTIME
TOPICAL | Status: DC | PRN
  Administered 2015-02-04: 1000 mL

## 2015-02-04 MED ORDER — EPHEDRINE SULFATE 50 MG/ML IJ SOLN
INTRAMUSCULAR | Status: DC | PRN
  Administered 2015-02-04: 10 mg via INTRAVENOUS

## 2015-02-04 MED ORDER — FENTANYL CITRATE (PF) 250 MCG/5ML IJ SOLN
INTRAMUSCULAR | Status: AC
  Filled 2015-02-04: qty 5

## 2015-02-04 MED ORDER — LIDOCAINE-EPINEPHRINE 1 %-1:100000 IJ SOLN
INTRAMUSCULAR | Status: DC | PRN
  Administered 2015-02-04: 10 mL via INTRADERMAL

## 2015-02-04 MED ORDER — CLINDAMYCIN PHOSPHATE 600 MG/50ML IV SOLN: 600.0000 mg | Freq: Once | INTRAVENOUS | Status: DC

## 2015-02-04 SURGICAL SUPPLY — 49 items
BLADE SURG 15 STRL LF DISP TIS (BLADE) IMPLANT
BLADE SURG 15 STRL SS (BLADE)
BLADE SURG ROTATE 9660 (MISCELLANEOUS) IMPLANT
CANISTER SUCTION 2500CC (MISCELLANEOUS) ×3 IMPLANT
CLEANER TIP ELECTROSURG 2X2 (MISCELLANEOUS) ×3 IMPLANT
CONT SPEC 4OZ CLIKSEAL STRL BL (MISCELLANEOUS) ×3 IMPLANT
COVER SURGICAL LIGHT HANDLE (MISCELLANEOUS) ×3 IMPLANT
DECANTER SPIKE VIAL GLASS SM (MISCELLANEOUS) ×3 IMPLANT
DERMABOND ADVANCED (GAUZE/BANDAGES/DRESSINGS) ×2
DERMABOND ADVANCED .7 DNX12 (GAUZE/BANDAGES/DRESSINGS) ×1 IMPLANT
DRAPE PROXIMA HALF (DRAPES) ×3 IMPLANT
ELECT COATED BLADE 2.86 ST (ELECTRODE) ×3 IMPLANT
ELECT REM PT RETURN 9FT ADLT (ELECTROSURGICAL) ×3
ELECTRODE REM PT RTRN 9FT ADLT (ELECTROSURGICAL) ×1 IMPLANT
GAUZE SPONGE 4X4 16PLY XRAY LF (GAUZE/BANDAGES/DRESSINGS) ×3 IMPLANT
GAUZE XEROFORM 5X9 LF (GAUZE/BANDAGES/DRESSINGS) IMPLANT
GLOVE BIOGEL PI IND STRL 6.5 (GLOVE) ×2 IMPLANT
GLOVE BIOGEL PI INDICATOR 6.5 (GLOVE) ×4
GLOVE ECLIPSE 6.5 STRL STRAW (GLOVE) ×3 IMPLANT
GLOVE SURG SS PI 7.5 STRL IVOR (GLOVE) ×6 IMPLANT
GOWN STRL REUS W/ TWL LRG LVL3 (GOWN DISPOSABLE) ×3 IMPLANT
GOWN STRL REUS W/TWL LRG LVL3 (GOWN DISPOSABLE) ×6
HOLDER TRACH TUBE VELCRO 19.5 (MISCELLANEOUS) IMPLANT
KIT BASIN OR (CUSTOM PROCEDURE TRAY) ×3 IMPLANT
KIT ROOM TURNOVER OR (KITS) ×3 IMPLANT
KIT SUCTION CATH 14FR (SUCTIONS) IMPLANT
NEEDLE HYPO 25GX1X1/2 BEV (NEEDLE) ×3 IMPLANT
NS IRRIG 1000ML POUR BTL (IV SOLUTION) ×3 IMPLANT
PACK EENT II TURBAN DRAPE (CUSTOM PROCEDURE TRAY) ×3 IMPLANT
PAD ARMBOARD 7.5X6 YLW CONV (MISCELLANEOUS) ×6 IMPLANT
PENCIL BUTTON HOLSTER BLD 10FT (ELECTRODE) ×3 IMPLANT
SPONGE INTESTINAL PEANUT (DISPOSABLE) IMPLANT
SUT CHROMIC 2 0 SH (SUTURE) ×3 IMPLANT
SUT CHROMIC 3 0 SH 27 (SUTURE) ×3 IMPLANT
SUT ETHILON 2 0 FS 18 (SUTURE) ×6 IMPLANT
SUT SILK 2 0 FS (SUTURE) ×3 IMPLANT
SUT SILK 3 0 REEL (SUTURE) ×3 IMPLANT
SUT VIC AB 3-0 SH 27 (SUTURE) ×2
SUT VIC AB 3-0 SH 27X BRD (SUTURE) ×1 IMPLANT
SUT VICRYL 4-0 PS2 18IN ABS (SUTURE) ×3 IMPLANT
SYR 20CC LL (SYRINGE) ×3 IMPLANT
SYR BULB IRRIGATION 50ML (SYRINGE) ×3 IMPLANT
SYR CONTROL 10ML LL (SYRINGE) ×3 IMPLANT
TOWEL OR 17X24 6PK STRL BLUE (TOWEL DISPOSABLE) ×3 IMPLANT
TOWEL OR 17X26 10 PK STRL BLUE (TOWEL DISPOSABLE) ×3 IMPLANT
TUBE CONNECTING 12'X1/4 (SUCTIONS) ×1
TUBE CONNECTING 12X1/4 (SUCTIONS) ×2 IMPLANT
WATER STERILE IRR 1000ML POUR (IV SOLUTION) ×3 IMPLANT
YANKAUER SUCT BULB TIP NO VENT (SUCTIONS) ×3 IMPLANT

## 2015-02-04 NOTE — Anesthesia Postprocedure Evaluation (Signed)
Anesthesia Post Note  Patient: Polk Nordhoff  Procedure(s) Performed: Procedure(s) (LRB): TRACHEAL ESOPHAGEAL FISTULA REPAIR (N/A)  Patient location during evaluation: PACU Anesthesia Type: General Level of consciousness: awake and alert Pain management: pain level controlled Vital Signs Assessment: post-procedure vital signs reviewed and stable Respiratory status: spontaneous breathing, nonlabored ventilation, respiratory function stable and patient connected to nasal cannula oxygen Cardiovascular status: blood pressure returned to baseline and stable Postop Assessment: no signs of nausea or vomiting Anesthetic complications: no    Last Vitals:  Filed Vitals:   02/04/15 1000 02/04/15 1006  BP: 92/61 113/85  Pulse: 98 97  Temp: 36.7 C   Resp: 18     Last Pain: There were no vitals filed for this visit.               Arlene Genova S

## 2015-02-04 NOTE — Op Note (Signed)
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 DESCRIPTION OF OPERATION: The patient was brought to the operating room and was placed in the supine position and intubated and placed under general anesthesia by anesthesiology. The trachceocutaneous fistula and surrounding tissue were excised in an elliptical fashion with the 15 blade and bovie down to teh anterior tracheal wall. Saline instillation and valsalva did not demonstrate any tracheal opening but I put a vicryl figure of 8 stitch into the anterior tracheal wall as an extra precaution. I them closed the strap  muscle layer with interrupted deep  Buried vicryl, closed the subcutaneous layer with interrupted deep buried vicryl, and them closed the skin with dermabond. Meticulous hemostasis was achieved. The patient was returned to anesthesia and extubated. He tolerated the procedure well with no immediate complications and was taken to the postoperative recovery area in good condition.   Dr. Melvenia BeamMitchell August Gosser was present and performed the entire procedure. 02/04/2015 9:28 AM Melvenia BeamGore, Manasi Dishon

## 2015-02-04 NOTE — Transfer of Care (Signed)
Immediate Anesthesia Transfer of Care Note  Patient: Arthur Martin  Procedure(s) Performed: Procedure(s): TRACHEAL ESOPHAGEAL FISTULA REPAIR (N/A)  Patient Location: PACU  Anesthesia Type:General  Level of Consciousness: awake, alert , oriented and patient cooperative  Airway & Oxygen Therapy: Patient Spontanous Breathing and Patient connected to nasal cannula oxygen  Post-op Assessment: Report given to RN, Post -op Vital signs reviewed and stable, Patient moving all extremities, Patient moving all extremities X 4 and Patient able to stick tongue midline  Post vital signs: Reviewed and stable  Last Vitals:  Filed Vitals:   02/04/15 0647 02/04/15 0926  BP: 110/84 101/63  Pulse: 64 98  Temp: 36.4 C 36.7 C  Resp: 20 18    Complications: No apparent anesthesia complications

## 2015-02-04 NOTE — Anesthesia Preprocedure Evaluation (Signed)
Anesthesia Evaluation  Patient identified by MRN, date of birth, ID band Patient awake    Reviewed: Allergy & Precautions, NPO status , Patient's Chart, lab work & pertinent test results  Airway Mallampati: II  TM Distance: >3 FB Neck ROM: Full    Dental no notable dental hx.    Pulmonary Current Smoker,    Pulmonary exam normal breath sounds clear to auscultation       Cardiovascular negative cardio ROS Normal cardiovascular exam Rhythm:Regular Rate:Normal     Neuro/Psych negative neurological ROS  negative psych ROS   GI/Hepatic negative GI ROS, Neg liver ROS,   Endo/Other  negative endocrine ROS  Renal/GU negative Renal ROS  negative genitourinary   Musculoskeletal negative musculoskeletal ROS (+)   Abdominal   Peds negative pediatric ROS (+)  Hematology negative hematology ROS (+)   Anesthesia Other Findings   Reproductive/Obstetrics negative OB ROS                             Anesthesia Physical Anesthesia Plan  ASA: II  Anesthesia Plan: General   Post-op Pain Management:    Induction: Intravenous  Airway Management Planned: Oral ETT and Video Laryngoscope Planned  Additional Equipment:   Intra-op Plan:   Post-operative Plan: Extubation in OR  Informed Consent: I have reviewed the patients History and Physical, chart, labs and discussed the procedure including the risks, benefits and alternatives for the proposed anesthesia with the patient or authorized representative who has indicated his/her understanding and acceptance.   Dental advisory given  Plan Discussed with: CRNA and Surgeon  Anesthesia Plan Comments:         Anesthesia Quick Evaluation

## 2015-02-04 NOTE — Discharge Instructions (Signed)
Incision has dermabond so keep dry for 72 hours then may clean gently with soap and water as needed. Rx for hydrocodone given to family, antibiotic and zofran sent to pharmacy. Follow up in the office in 1 to 2 weeks. Regular diet, up ad lib.

## 2015-02-04 NOTE — Anesthesia Procedure Notes (Signed)
Procedure Name: Intubation Date/Time: 02/04/2015 8:43 AM Performed by: Adonis HousekeeperNGELL, Khaleel Beckom M Pre-anesthesia Checklist: Patient identified, Emergency Drugs available, Suction available and Patient being monitored Patient Re-evaluated:Patient Re-evaluated prior to inductionOxygen Delivery Method: Circle system utilized Preoxygenation: Pre-oxygenation with 100% oxygen Intubation Type: IV induction Ventilation: Mask ventilation without difficulty Laryngoscope Size: Glidescope and 3 Grade View: Grade I Tube type: Oral Tube size: 7.0 mm Number of attempts: 1 Airway Equipment and Method: Stylet and Video-laryngoscopy Placement Confirmation: ETT inserted through vocal cords under direct vision,  positive ETCO2 and breath sounds checked- equal and bilateral Secured at: 22 cm Tube secured with: Tape Dental Injury: Teeth and Oropharynx as per pre-operative assessment

## 2015-02-07 ENCOUNTER — Encounter (HOSPITAL_COMMUNITY): Payer: Self-pay | Admitting: Otolaryngology

## 2016-02-09 IMAGING — RF DG SWALLOWING FUNCTION - NRPT [facility]
7 series · 20 of 24 positions shown · non-contrast
Comparison: none

[Series 1: cp_standard · 0.36mm/px · 2 of 44 frames shown (1 of 7)]
[frame 7/44]
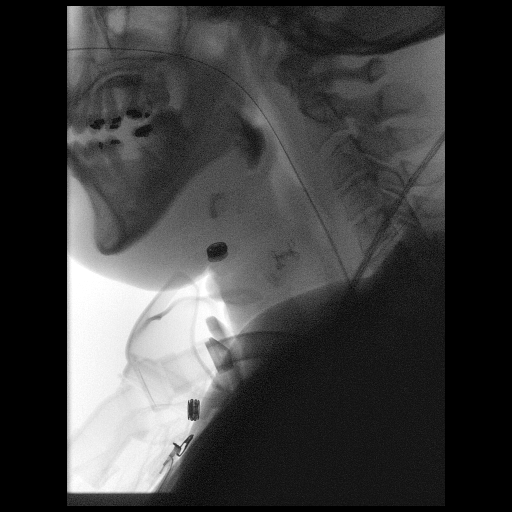
[frame 13/44]
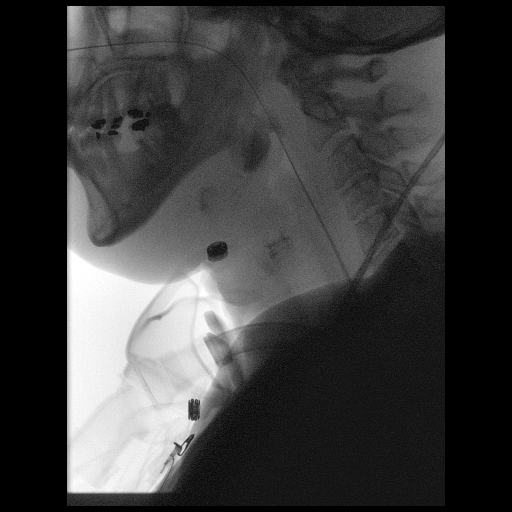

[Series 2: cp_standard · 0.36mm/px · 4 of 71 frames shown (2 of 7)]
[frame 11/71]
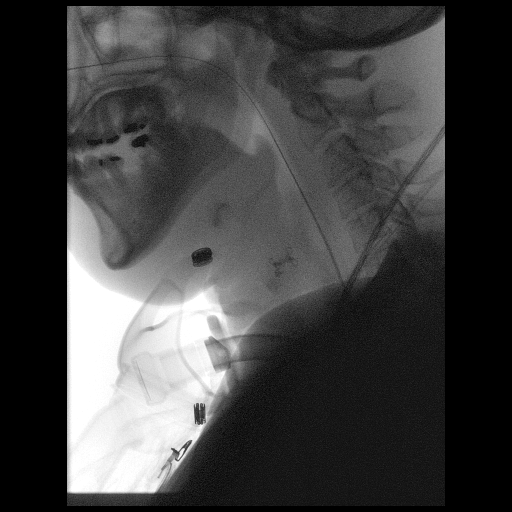
[frame 36/71]
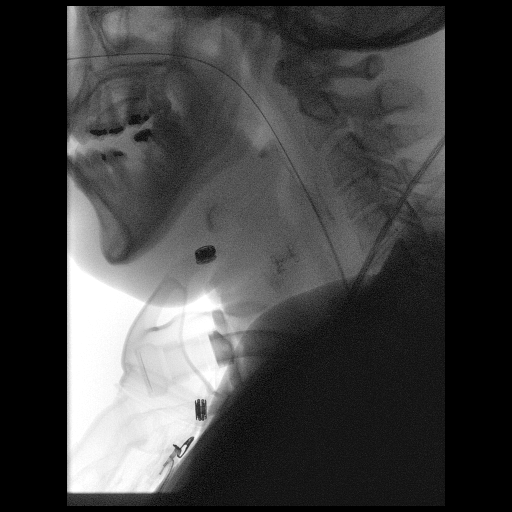
[frame 48/71]
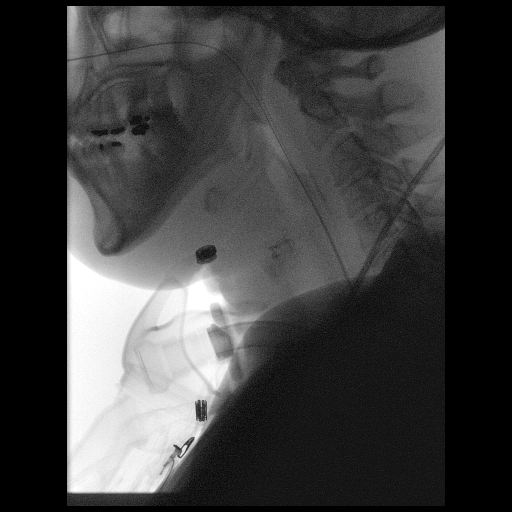
[frame 61/71]
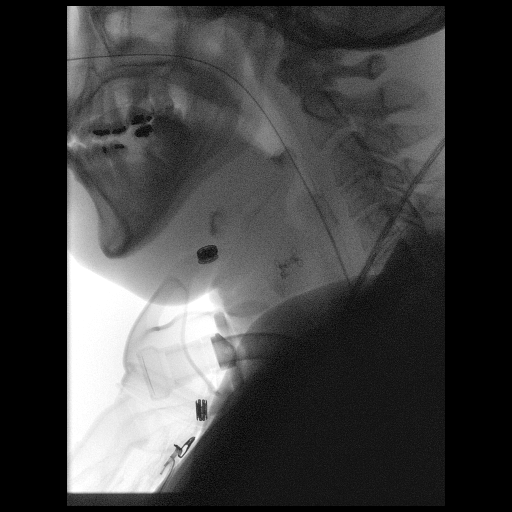

[Series 3: cp_standard · 0.36mm/px · 2 of 109 frames shown (3 of 7)]
[frame 17/109]
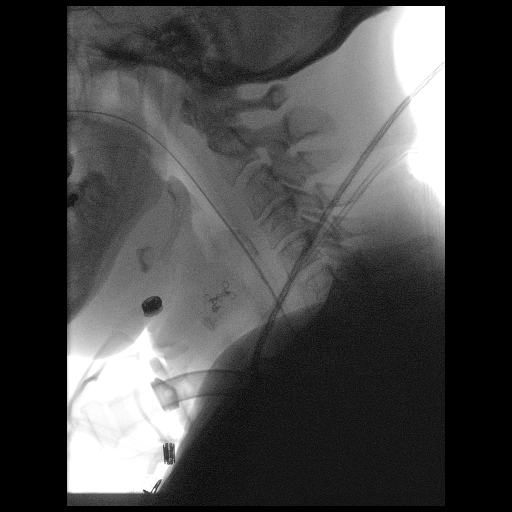
[frame 109/109]
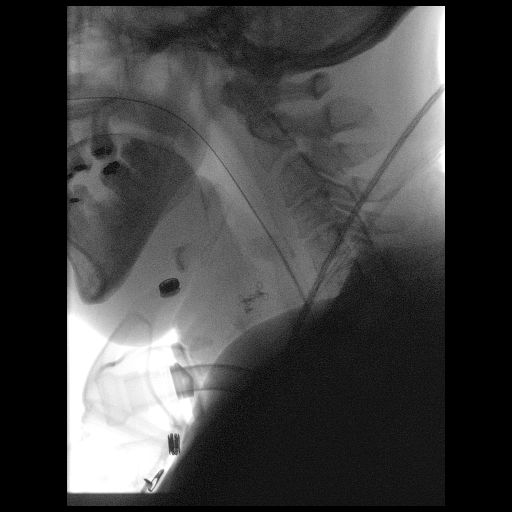

[Series 4: cp_standard · 0.36mm/px · 4 of 155 frames shown (4 of 7)]
[frame 24/155]
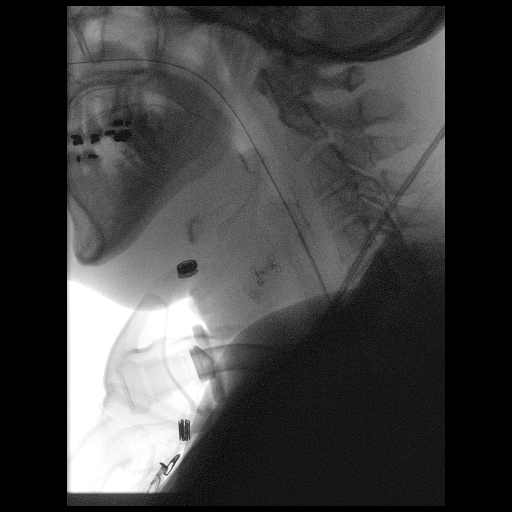
[frame 78/155]
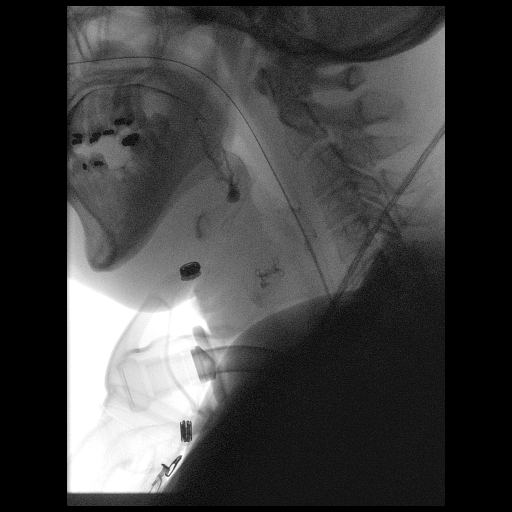
[frame 132/155]
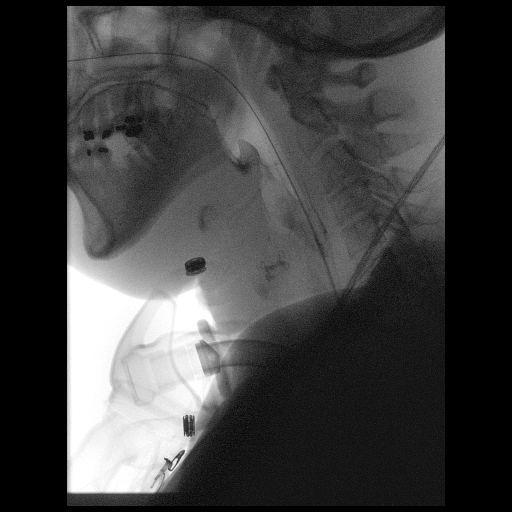
[frame 135/155]
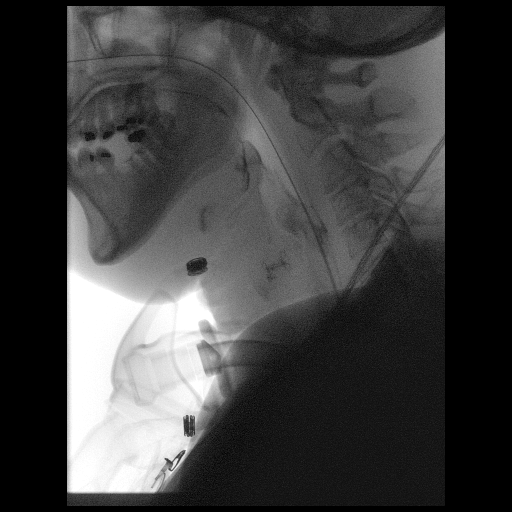

[Series 5: cp_standard · 0.36mm/px · 2 of 167 frames shown (5 of 7)]
[frame 107/167]
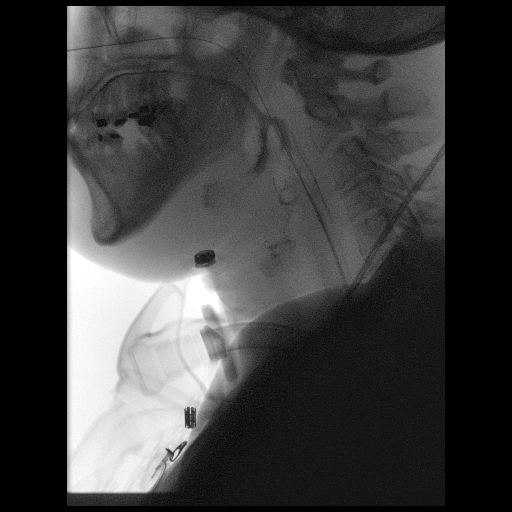
[frame 142/167]
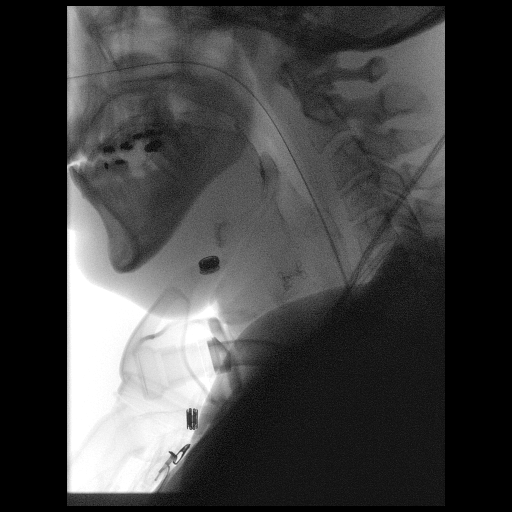

[Series 6: cp_standard · 0.36mm/px · 3 of 73 frames shown (6 of 7)]
[frame 11/73]
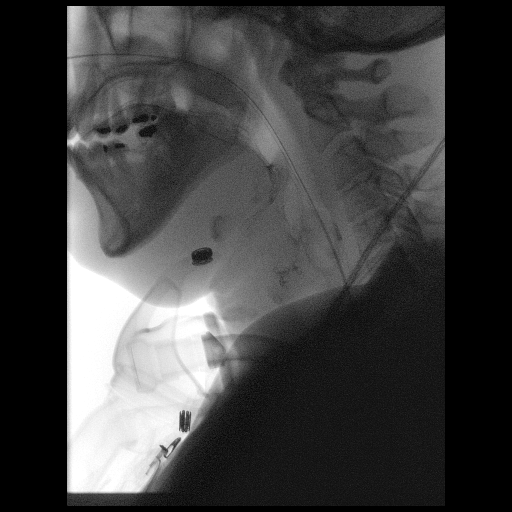
[frame 29/73]
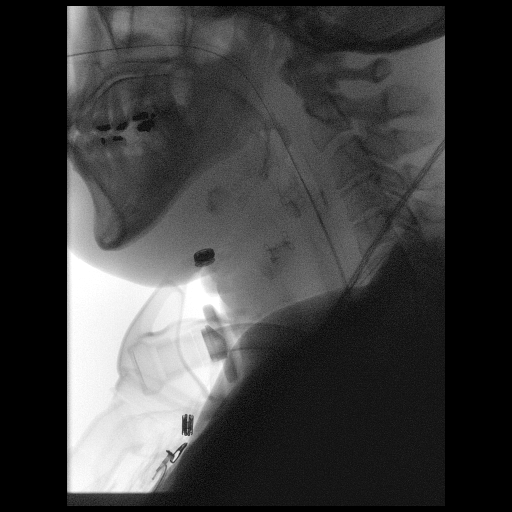
[frame 37/73]
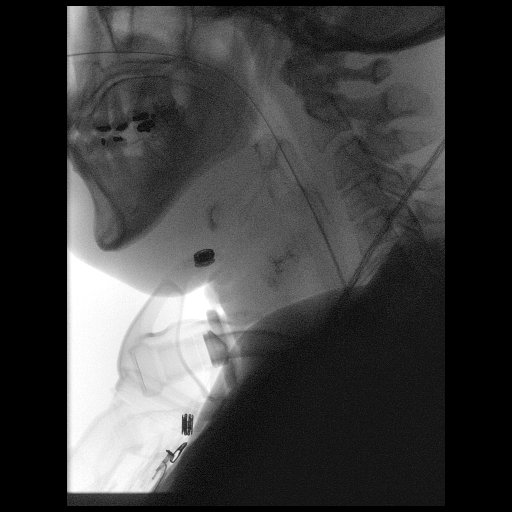

[Series 7: cp_standard · 0.36mm/px · 3 of 97 frames shown (7 of 7)]
[frame 37/97]
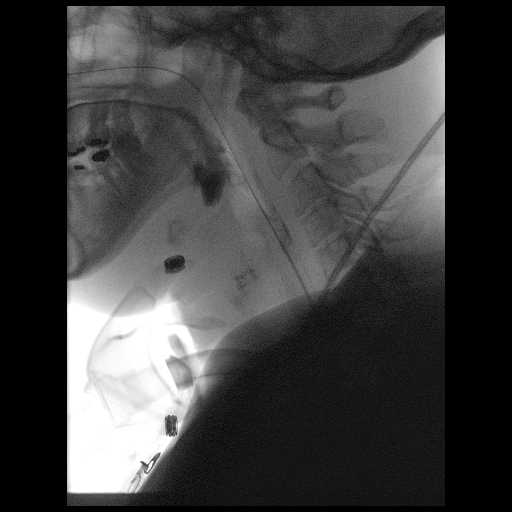
[frame 49/97]
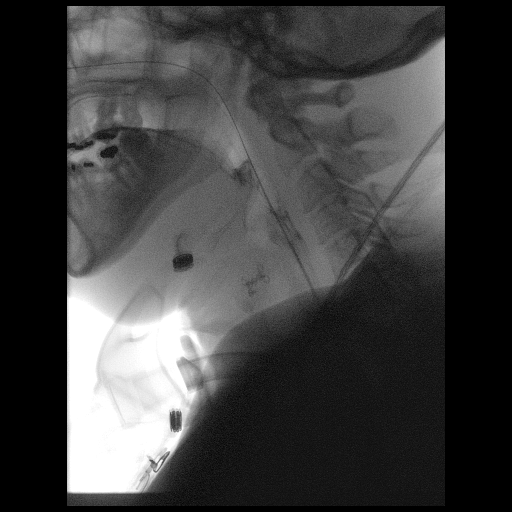
[frame 83/97]
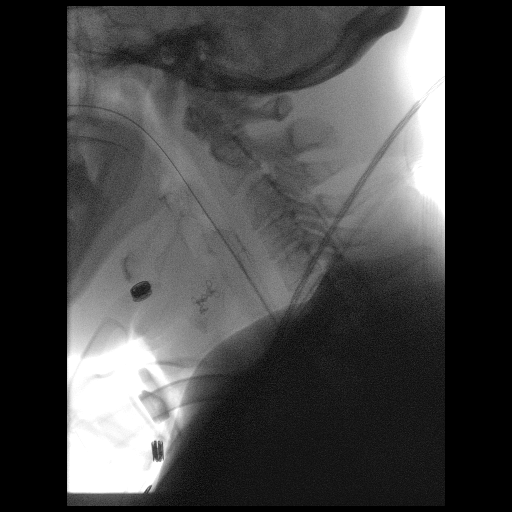

[20 of 24 positions shown; findings below may reference images not displayed]

Canned report from images found in remote index.

Refer to host system for actual result text.
